# Patient Record
Sex: Male | Born: 1975
Health system: Southern US, Community
[De-identification: ages and names within clinical notes are randomized; demographics above are authoritative.]

## PROBLEM LIST (undated history)

## (undated) DIAGNOSIS — F329 Major depressive disorder, single episode, unspecified: Secondary | ICD-10-CM

## (undated) DIAGNOSIS — F419 Anxiety disorder, unspecified: Secondary | ICD-10-CM

## (undated) DIAGNOSIS — F32A Depression, unspecified: Secondary | ICD-10-CM

## (undated) DIAGNOSIS — R7302 Impaired glucose tolerance (oral): Secondary | ICD-10-CM

## (undated) DIAGNOSIS — K219 Gastro-esophageal reflux disease without esophagitis: Secondary | ICD-10-CM

## (undated) DIAGNOSIS — I10 Essential (primary) hypertension: Secondary | ICD-10-CM

## (undated) DIAGNOSIS — F429 Obsessive-compulsive disorder, unspecified: Secondary | ICD-10-CM

## (undated) HISTORY — DX: Essential (primary) hypertension: I10

## (undated) HISTORY — DX: Gastro-esophageal reflux disease without esophagitis: K21.9

## (undated) HISTORY — DX: Obsessive-compulsive disorder, unspecified: F42.9

## (undated) HISTORY — DX: Depression, unspecified: F32.A

## (undated) HISTORY — DX: Anxiety disorder, unspecified: F41.9

## (undated) HISTORY — DX: Impaired glucose tolerance (oral): R73.02

## (undated) HISTORY — DX: Major depressive disorder, single episode, unspecified: F32.9

---

## 2004-01-22 ENCOUNTER — Emergency Department (HOSPITAL_COMMUNITY): Admission: EM | Admit: 2004-01-22 | Discharge: 2004-01-22 | Payer: Self-pay | Admitting: Emergency Medicine

## 2004-06-12 ENCOUNTER — Emergency Department (HOSPITAL_COMMUNITY): Admission: EM | Admit: 2004-06-12 | Discharge: 2004-06-12 | Payer: Self-pay | Admitting: Emergency Medicine

## 2008-03-10 ENCOUNTER — Encounter: Admission: RE | Admit: 2008-03-10 | Discharge: 2008-03-10 | Payer: Self-pay | Admitting: Family Medicine

## 2008-09-21 ENCOUNTER — Emergency Department (HOSPITAL_COMMUNITY): Admission: EM | Admit: 2008-09-21 | Discharge: 2008-09-21 | Payer: Self-pay | Admitting: Emergency Medicine

## 2008-10-24 ENCOUNTER — Emergency Department (HOSPITAL_COMMUNITY): Admission: EM | Admit: 2008-10-24 | Discharge: 2008-10-24 | Payer: Self-pay | Admitting: Emergency Medicine

## 2009-01-31 ENCOUNTER — Emergency Department (HOSPITAL_COMMUNITY): Admission: EM | Admit: 2009-01-31 | Discharge: 2009-01-31 | Payer: Self-pay | Admitting: Emergency Medicine

## 2009-06-15 ENCOUNTER — Emergency Department (HOSPITAL_COMMUNITY): Admission: EM | Admit: 2009-06-15 | Discharge: 2009-06-15 | Payer: Self-pay | Admitting: Emergency Medicine

## 2011-02-18 LAB — CBC
HCT: 44.1 % (ref 39.0–52.0)
Hemoglobin: 15.5 g/dL (ref 13.0–17.0)
MCHC: 35.1 g/dL (ref 30.0–36.0)
MCV: 87.1 fL (ref 78.0–100.0)
Platelets: 328 10*3/uL (ref 150–400)
RBC: 5.07 MIL/uL (ref 4.22–5.81)
RDW: 12.5 % (ref 11.5–15.5)
WBC: 8.9 10*3/uL (ref 4.0–10.5)

## 2011-02-18 LAB — POCT CARDIAC MARKERS
CKMB, poc: <1 ng/mL — ABNORMAL LOW (ref 1.0–8.0)
Myoglobin, poc: 74.4 ng/mL (ref 12–200)
Troponin i, poc: <0.05 ng/mL (ref 0.00–0.09)

## 2011-02-18 LAB — POCT I-STAT, CHEM 8
BUN: 12 mg/dL (ref 6–23)
Calcium, Ion: 1.05 mmol/L — ABNORMAL LOW (ref 1.12–1.32)
Chloride: 105 mEq/L (ref 96–112)
Creatinine, Ser: 0.9 mg/dL (ref 0.4–1.5)
Glucose, Bld: 94 mg/dL (ref 70–99)
HCT: 44 % (ref 39.0–52.0)
Hemoglobin: 15 g/dL (ref 13.0–17.0)
Potassium: 4.1 mEq/L (ref 3.5–5.1)
Sodium: 138 mEq/L (ref 135–145)
TCO2: 23 mmol/L (ref 0–100)

## 2011-02-18 LAB — DIFFERENTIAL
Basophils Absolute: 0 10*3/uL (ref 0.0–0.1)
Basophils Relative: 0 % (ref 0–1)
Eosinophils Absolute: 0.2 10*3/uL (ref 0.0–0.7)
Eosinophils Relative: 2 % (ref 0–5)
Lymphocytes Relative: 15 % (ref 12–46)
Lymphs Abs: 1.4 10*3/uL (ref 0.7–4.0)
Monocytes Absolute: 0.7 10*3/uL (ref 0.1–1.0)
Monocytes Relative: 7 % (ref 3–12)
Neutro Abs: 6.7 10*3/uL (ref 1.7–7.7)
Neutrophils Relative %: 75 % (ref 43–77)

## 2011-08-18 LAB — DIFFERENTIAL
Basophils Absolute: 0 10*3/uL (ref 0.0–0.1)
Basophils Relative: 1 % (ref 0–1)
Eosinophils Absolute: 0.2 10*3/uL (ref 0.0–0.7)
Eosinophils Relative: 3 % (ref 0–5)
Lymphocytes Relative: 25 % (ref 12–46)
Lymphs Abs: 1.4 10*3/uL (ref 0.7–4.0)
Monocytes Absolute: 0.5 10*3/uL (ref 0.1–1.0)
Monocytes Relative: 9 % (ref 3–12)
Neutro Abs: 3.5 10*3/uL (ref 1.7–7.7)
Neutrophils Relative %: 61 % (ref 43–77)

## 2011-08-18 LAB — CBC
HCT: 44.9 % (ref 39.0–52.0)
Hemoglobin: 15.9 g/dL (ref 13.0–17.0)
MCHC: 35.4 g/dL (ref 30.0–36.0)
MCV: 86.4 fL (ref 78.0–100.0)
Platelets: 302 10*3/uL (ref 150–400)
RBC: 5.19 MIL/uL (ref 4.22–5.81)
RDW: 12 % (ref 11.5–15.5)
WBC: 5.7 10*3/uL (ref 4.0–10.5)

## 2011-08-18 LAB — POCT I-STAT, CHEM 8
BUN: 9 mg/dL (ref 6–23)
Calcium, Ion: 1.08 mmol/L — ABNORMAL LOW (ref 1.12–1.32)
Chloride: 104 mEq/L (ref 96–112)
Creatinine, Ser: 0.9 mg/dL (ref 0.4–1.5)
Glucose, Bld: 116 mg/dL — ABNORMAL HIGH (ref 70–99)
HCT: 45 % (ref 39.0–52.0)
Hemoglobin: 15.3 g/dL (ref 13.0–17.0)
Potassium: 3.6 mEq/L (ref 3.5–5.1)
Sodium: 139 mEq/L (ref 135–145)
TCO2: 27 mmol/L (ref 0–100)

## 2011-08-18 LAB — HEPATIC FUNCTION PANEL
ALT: 23 U/L (ref 0–53)
AST: 31 U/L (ref 0–37)
Albumin: 4.4 g/dL (ref 3.5–5.2)
Alkaline Phosphatase: 66 U/L (ref 39–117)
Bilirubin, Direct: 0.2 mg/dL (ref 0.0–0.3)
Indirect Bilirubin: 0.6 mg/dL (ref 0.3–0.9)
Total Bilirubin: 0.8 mg/dL (ref 0.3–1.2)
Total Protein: 7.1 g/dL (ref 6.0–8.3)

## 2011-08-18 LAB — POCT CARDIAC MARKERS
CKMB, poc: <1 ng/mL — ABNORMAL LOW (ref 1.0–8.0)
Myoglobin, poc: 74.7 ng/mL (ref 12–200)
Troponin i, poc: <0.05 ng/mL (ref 0.00–0.09)

## 2011-08-18 LAB — LIPASE, BLOOD: Lipase: 21 U/L (ref 11–59)

## 2014-12-09 ENCOUNTER — Encounter: Payer: Self-pay | Admitting: *Deleted

## 2014-12-10 ENCOUNTER — Encounter: Payer: Self-pay | Admitting: Interventional Cardiology

## 2014-12-10 ENCOUNTER — Ambulatory Visit (INDEPENDENT_AMBULATORY_CARE_PROVIDER_SITE_OTHER): Payer: 59 | Admitting: Interventional Cardiology

## 2014-12-10 VITALS — BP 162/90 | HR 84 | Ht 70.0 in | Wt 373.8 lb

## 2014-12-10 DIAGNOSIS — R0789 Other chest pain: Secondary | ICD-10-CM

## 2014-12-10 DIAGNOSIS — I10 Essential (primary) hypertension: Secondary | ICD-10-CM

## 2014-12-10 NOTE — Patient Instructions (Signed)
Your physician has requested that you have an echocardiogram. Echocardiography is a painless test that uses sound waves to create images of your heart. It provides your doctor with information about the size and shape of your heart and how well your heart's chambers and valves are working. This procedure takes approximately one hour. There are no restrictions for this procedure.   Your physician recommends that you schedule a follow-up appointment as needed with Dr. Varanasi.  

## 2014-12-10 NOTE — Progress Notes (Signed)
Patient ID: Justin BeaverKevin Reichel, male   DOB: 03-13-1976, 39 y.o.   MRN: [id number]     Patient ID: Justin BeaverKevin Bernier MRN: [id number] DOB/AGE: 03-13-1976 39 y.o.   Referring Physician  Milus HeightNoelle Redmon, PA-C   Reason for Consultation Family h/o CAD  HPI: 39 y/o who has an older brother who had an ascending aortic aneuysm and bicuspid aortic valve.  He had a repair with mechanical valve placement.  The patient is here today to be checked as well.  He has not had any chest pain or SHOB.  He is a IT sales professionalfirefighter.  He does feel a burning at times in the center of his chest.  It may be related to reflux as he says it is more frequent with certain foods.  He walks up stairs frequently without any problems.     Current Outpatient Prescriptions  Medication Sig Dispense Refill  . ALPRAZolam (XANAX) 0.5 MG tablet Take 0.5 mg by mouth daily as needed. For anxiety  0  . Ascorbic Acid (VITAMIN C) 1000 MG tablet Take 1,000 mg by mouth daily.    . cyanocobalamin 500 MCG tablet Take 500 mcg by mouth daily.    Marland Kitchen. DEXILANT 30 MG capsule Take 30 mg by mouth daily.  3  . DULoxetine (CYMBALTA) 60 MG capsule Take 60 mg by mouth daily.  5  . losartan (COZAAR) 50 MG tablet Take 50 mg by mouth daily.  3  . metoprolol succinate (TOPROL-XL) 50 MG 24 hr tablet Take 50 mg by mouth daily.  1  . Omega-3 Fatty Acids (FISH OIL) 1000 MG CAPS Take 1,000 mg by mouth daily.     No current facility-administered medications for this visit.   Past Medical History  Diagnosis Date  . HTN (hypertension)   . GERD (gastroesophageal reflux disease)   . OCD (obsessive compulsive disorder)   . Depression   . Anxiety   . Glucose intolerance (impaired glucose tolerance)     Family History  Problem Relation Age of Onset  . Hypertension Father   . Hypertension Mother   . Heart disease Brother     History   Social History  . Marital Status: Married    Spouse Name: N/A    Number of Children: N/A  . Years of Education: N/A   Occupational  History  . Not on file.   Social History Main Topics  . Smoking status: Never Smoker   . Smokeless tobacco: Not on file  . Alcohol Use: No  . Drug Use: No  . Sexual Activity: Not on file   Other Topics Concern  . Not on file   Social History Narrative    History reviewed. No pertinent past surgical history.    (Not in a hospital admission)  Review of systems complete and found to be negative unless listed above .  No nausea, vomiting.  No fever chills, No focal weakness,  No palpitations.  Physical Exam: Filed Vitals:   12/10/14 1405  BP: 162/90  Pulse: 84    Weight: (!) 373 lb 12.8 oz (169.555 kg)  Physical exam: In no apparent distress [place]/AT EOMI No JVD, No carotid bruit RRR S1S2  No wheezing Soft. NT, nondistended No edema. No focal motor or sensory deficits Normal affect  Labs:   Lab Results  Component Value Date   WBC 8.9 06/15/2009   HGB 15.0 06/15/2009   HCT 44.0 06/15/2009   MCV 87.1 06/15/2009   PLT 328 06/15/2009   No results for input(s): NA,  K, CL, CO2, BUN, CREATININE, CALCIUM, PROT, BILITOT, ALKPHOS, ALT, AST, GLUCOSE in the last 168 hours.  Invalid input(s): LABALBU No results found for: CKTOTAL, CKMB, CKMBINDEX, TROPONINI No results found for: CHOL No results found for: HDL No results found for: LDLCALC No results found for: TRIG No results found for: CHOLHDL No results found for: LDLDIRECT     EKG: NSR, IRBBB, nonspecific ST segment changes  ASSESSMENT AND PLAN:   HTN: Amlodipine was recently changed to losartan 50 mg daily.  At other appts, BP has been well controlled.   Obesity:  THis is his biggest long term issue.  He was counseled to try to exercise more and eat healthy to lose weight.    Chest discomfort.  He has some discomfort in his chest.,  It is a burning.  Will check echo.  COuld be reflux.  Given brother with bicuspid Ao valve, will also check for structural heart disease.   Signed:   Fredric Mare, MD,  [facility] 12/10/2014, 2:35 PM

## 2014-12-22 ENCOUNTER — Other Ambulatory Visit (HOSPITAL_COMMUNITY): Payer: 59

## 2014-12-22 ENCOUNTER — Ambulatory Visit (HOSPITAL_COMMUNITY): Payer: 59 | Attending: Interventional Cardiology | Admitting: Cardiology

## 2014-12-22 ENCOUNTER — Other Ambulatory Visit (HOSPITAL_COMMUNITY): Payer: 59 | Admitting: Cardiology

## 2014-12-22 DIAGNOSIS — R0789 Other chest pain: Secondary | ICD-10-CM

## 2014-12-22 MED ORDER — PERFLUTREN LIPID MICROSPHERE
2.0000 mL | Freq: Once | INTRAVENOUS | Status: AC
  Administered 2014-12-22: 2 mL via INTRAVENOUS

## 2014-12-22 NOTE — Progress Notes (Signed)
Echo performed. 

## 2015-03-19 ENCOUNTER — Emergency Department (HOSPITAL_BASED_OUTPATIENT_CLINIC_OR_DEPARTMENT_OTHER): Payer: 59

## 2015-03-19 ENCOUNTER — Encounter (HOSPITAL_BASED_OUTPATIENT_CLINIC_OR_DEPARTMENT_OTHER): Payer: Self-pay

## 2015-03-19 ENCOUNTER — Emergency Department (HOSPITAL_BASED_OUTPATIENT_CLINIC_OR_DEPARTMENT_OTHER)
Admission: EM | Admit: 2015-03-19 | Discharge: 2015-03-19 | Disposition: A | Discharge status: Home / Self Care | Payer: 59 | Attending: Emergency Medicine | Admitting: Emergency Medicine

## 2015-03-19 DIAGNOSIS — Y939 Activity, unspecified: Secondary | ICD-10-CM | POA: Diagnosis not present

## 2015-03-19 DIAGNOSIS — Y280XXA Contact with sharp glass, undetermined intent, initial encounter: Secondary | ICD-10-CM | POA: Insufficient documentation

## 2015-03-19 DIAGNOSIS — F419 Anxiety disorder, unspecified: Secondary | ICD-10-CM | POA: Insufficient documentation

## 2015-03-19 DIAGNOSIS — F329 Major depressive disorder, single episode, unspecified: Secondary | ICD-10-CM | POA: Insufficient documentation

## 2015-03-19 DIAGNOSIS — Y999 Unspecified external cause status: Secondary | ICD-10-CM | POA: Diagnosis not present

## 2015-03-19 DIAGNOSIS — K219 Gastro-esophageal reflux disease without esophagitis: Secondary | ICD-10-CM | POA: Insufficient documentation

## 2015-03-19 DIAGNOSIS — I10 Essential (primary) hypertension: Secondary | ICD-10-CM | POA: Diagnosis not present

## 2015-03-19 DIAGNOSIS — Y929 Unspecified place or not applicable: Secondary | ICD-10-CM | POA: Insufficient documentation

## 2015-03-19 DIAGNOSIS — Z8659 Personal history of other mental and behavioral disorders: Secondary | ICD-10-CM | POA: Diagnosis not present

## 2015-03-19 DIAGNOSIS — Z79899 Other long term (current) drug therapy: Secondary | ICD-10-CM | POA: Diagnosis not present

## 2015-03-19 DIAGNOSIS — Z23 Encounter for immunization: Secondary | ICD-10-CM | POA: Insufficient documentation

## 2015-03-19 DIAGNOSIS — S81812A Laceration without foreign body, left lower leg, initial encounter: Secondary | ICD-10-CM | POA: Diagnosis not present

## 2015-03-19 MED ORDER — KETOROLAC TROMETHAMINE 60 MG/2ML IM SOLN
60.0000 mg | Freq: Once | INTRAMUSCULAR | Status: AC
  Administered 2015-03-19: 60 mg via INTRAMUSCULAR
  Filled 2015-03-19: qty 2

## 2015-03-19 MED ORDER — LIDOCAINE HCL 2 % IJ SOLN
20.0000 mL | Freq: Once | INTRAMUSCULAR | Status: AC
  Administered 2015-03-19: 10 mL via INTRADERMAL
  Filled 2015-03-19: qty 20

## 2015-03-19 MED ORDER — IBUPROFEN 800 MG PO TABS
800.0000 mg | ORAL_TABLET | Freq: Once | ORAL | Status: AC
  Administered 2015-03-19: 800 mg via ORAL
  Filled 2015-03-19: qty 1

## 2015-03-19 MED ORDER — TETANUS-DIPHTH-ACELL PERTUSSIS 5-2.5-18.5 LF-MCG/0.5 IM SUSP
0.5000 mL | Freq: Once | INTRAMUSCULAR | Status: AC
  Administered 2015-03-19: 0.5 mL via INTRAMUSCULAR
  Filled 2015-03-19: qty 0.5

## 2015-03-19 NOTE — ED Notes (Signed)
Pt reports a laceration from a piece of farm equipment today that broke off and cut his left lower leg - last tetanus - 6 years ago - 6 cm x 4 cm x 2 cm wound to LLE - wound cleansed at this time, bleeding controlled at this time with gauze placed.

## 2015-03-19 NOTE — ED Provider Notes (Signed)
CSN: [id number]     Arrival date & time 03/19/15  1543 History   First MD Initiated Contact with Patient 03/19/15 1810     Chief Complaint  Patient presents with  . Laceration     (Consider location/radiation/quality/duration/timing/severity/associated sxs/prior Treatment) HPI  Justin Alvarez is a 39 y.o. male presenting with laceration to left lower leg after piece of farm equipment that was metal broke off and cut his leg. This occurred around 3 PM this afternoon. Patient denies significant pain. Bleeding controlled. She denies fevers, chills, numbness,tingling, weakness.   Past Medical History  Diagnosis Date  . HTN (hypertension)   . GERD (gastroesophageal reflux disease)   . OCD (obsessive compulsive disorder)   . Depression   . Anxiety   . Glucose intolerance (impaired glucose tolerance)    History reviewed. No pertinent past surgical history. Family History  Problem Relation Age of Onset  . Hypertension Father   . Hypertension Mother   . Heart disease Brother    History  Substance Use Topics  . Smoking status: Never Smoker   . Smokeless tobacco: Not on file  . Alcohol Use: No    Review of Systems  Musculoskeletal: Negative for myalgias and joint swelling.  Skin: Positive for wound. Negative for color change.  Neurological: Negative for weakness and numbness.      Allergies  Ace inhibitors  Home Medications   Prior to Admission medications   Medication Sig Start Date End Date Taking? Authorizing Provider  ALPRAZolam Prudy Feeler(XANAX) 0.5 MG tablet Take 0.5 mg by mouth daily as needed. For anxiety 09/03/14  Yes Historical Provider, MD  Ascorbic Acid (VITAMIN C) 1000 MG tablet Take 1,000 mg by mouth daily.   Yes Historical Provider, MD  cyanocobalamin 500 MCG tablet Take 500 mcg by mouth daily.   Yes Historical Provider, MD  DEXILANT 30 MG capsule Take 30 mg by mouth daily. 12/01/14  Yes Historical Provider, MD  DULoxetine (CYMBALTA) 60 MG capsule Take 60 mg by mouth  daily. 10/28/14  Yes Historical Provider, MD  losartan (COZAAR) 50 MG tablet Take 50 mg by mouth daily. 11/18/14  Yes Historical Provider, MD  metoprolol succinate (TOPROL-XL) 50 MG 24 hr tablet Take 50 mg by mouth daily. 12/04/14  Yes Historical Provider, MD  Omega-3 Fatty Acids (FISH OIL) 1000 MG CAPS Take 1,000 mg by mouth daily.   Yes Historical Provider, MD   BP 161/90 mmHg  Pulse 78  Resp 20  Ht 5\' 11"  (1.803 m)  Wt 325 lb (147.419 kg)  BMI 45.35 kg/m2  SpO2 100% Physical Exam  Constitutional: He appears well-developed and well-nourished. No distress.  HENT:  Head: Normocephalic and atraumatic.  Eyes: Conjunctivae are normal. Right eye exhibits no discharge. Left eye exhibits no discharge.  Cardiovascular:  2+ DP and PT pulses equal bilaterally.  Pulmonary/Chest: Effort normal. No respiratory distress.  Neurological: He is alert. Coordination normal.  5/5 strength in bilateral lower extremities. Sensation intact.  Skin: He is not diaphoretic.  3 cm laceration to left lower extremity at mid shin without gross contamination without any evidence of infection. Bleeding controlled.  Psychiatric: He has a normal mood and affect. His behavior is normal.  Nursing note and vitals reviewed.   ED Course  Procedures (including critical care time) Labs Review Labs Reviewed - No data to display  Imaging Review Dg Tibia/fibula Left  03/19/2015   CLINICAL DATA:  Laceration from a piece of farm equipment today. Injury to the lower leg. Initial encounter.  EXAM: LEFT TIBIA AND FIBULA - 2 VIEW  COMPARISON:  Left ankle radiographs 02/23/2006  FINDINGS: No acute fracture of the tibia or fibula is identified. The knee and ankle are located. New, mild to moderate degenerative changes are noted at the tibiotalar articulation. Small plantar and posterior calcaneal enthesophytes appear slightly larger than on the prior study. No lytic or blastic osseous lesion. No soft tissue abnormality or radiopaque  foreign body.  IMPRESSION: 1. No acute osseous abnormality or radiopaque foreign body identified. 2. Degenerative changes at the ankle.   Electronically Signed   By: Sebastian AcheAllen  Grady   On: 03/19/2015 18:30     EKG Interpretation None       LACERATION REPAIR Performed by: Oswaldo ConroyREECH, Fowler Antos Authorized by: Oswaldo ConroyREECH, Shaina Gullatt Consent: Verbal consent obtained. Risks and benefits: risks, benefits and alternatives were discussed Consent given by: patient Patient identity confirmed: provided demographic data Prepped and Draped in normal sterile fashion Wound explored  Laceration Location: Dorsal mid shin of left lower extremity  Laceration Length: 3-4 cm  No Foreign Bodies seen or palpated  Anesthesia: local infiltration  Local anesthetic: lidocaine 2% w/o epinephrine  Anesthetic total: 6 ml  Irrigation method: syringe Amount of cleaning: standard  Skin closure: 3-0 Proline  Number of sutures: 4  Technique: simple interrupted  Patient tolerance: Patient tolerated the procedure well with no immediate complications.   MDM   Final diagnoses:  Laceration of lower extremity, left, initial encounter   Tdap booster given. Laceration occurred < 8 hours prior to repair which was well tolerated. VSS. Neurovascularly intact. Laceration anesthetized, irrigated and repaired without immediate complications. Bacitracin and sterile dressing applied. No indication for antibiotics Discussed suture home care w pt and return with evidence of infection and answered questions. Pt to f-u for wound check and suture removal in 8-10 days. Pt is hemodynamically stable w no complaints prior to dc.    Discussed return precautions with patient. Discussed all results and patient verbalizes understanding and agrees with plan.      Oswaldo ConroyVictoria Barton Want, PA-C 03/19/15 1930  Rolan BuccoMelanie Belfi, MD 03/19/15 [phone]

## 2015-03-19 NOTE — Discharge Instructions (Signed)
Keep wound dry and do not remove dressing for 24 hours if possible. After that, wash gently morning and night (every 12 hours) with soap and water. Use a topical antibiotic ointment and cover with a bandaid or gauze.  °  °Do NOT use rubbing alcohol or hydrogen peroxide, do not soak the area °  °Present to your primary care doctor or the urgent care of your choice, or the ED for suture removal in 7-10 days. °  °Every attempt was made to remove foreign body (contaminants) from the wound.  However, there is always a chance that some may remain in the wound. This can  increase your risk of infection. °  °If you see signs of infection (warmth, redness, tenderness, pus, sharp increase in pain, fever, red streaking in the skin) immediately return to the emergency department. °  °After the wound heals fully, apply sunscreen for 6-12 months to minimize scarring.  ° ° °Laceration Care, Adult °A laceration is a cut or lesion that goes through all layers of the skin and into the tissue just beneath the skin. °TREATMENT  °Some lacerations may not require closure. Some lacerations may not be able to be closed due to an increased risk of infection. It is important to see your caregiver as soon as possible after an injury to minimize the risk of infection and maximize the opportunity for successful closure. °If closure is appropriate, pain medicines may be given, if needed. The wound will be cleaned to help prevent infection. Your caregiver will use stitches (sutures), staples, wound glue (adhesive), or skin adhesive strips to repair the laceration. These tools bring the skin edges together to allow for faster healing and a better cosmetic outcome. However, all wounds will heal with a scar. Once the wound has healed, scarring can be minimized by covering the wound with sunscreen during the day for 1 full year. °HOME CARE INSTRUCTIONS  °For sutures or staples: °· Keep the wound clean and dry. °· If you were given a bandage  (dressing), you should change it at least once a day. Also, change the dressing if it becomes wet or dirty, or as directed by your caregiver. °· Wash the wound with soap and water 2 times a day. Rinse the wound off with water to remove all soap. Pat the wound dry with a clean towel. °· After cleaning, apply a thin layer of the antibiotic ointment as recommended by your caregiver. This will help prevent infection and keep the dressing from sticking. °· You may shower as usual after the first 24 hours. Do not soak the wound in water until the sutures are removed. °· Only take over-the-counter or prescription medicines for pain, discomfort, or fever as directed by your caregiver. °· Get your sutures or staples removed as directed by your caregiver. °For skin adhesive strips: °· Keep the wound clean and dry. °· Do not get the skin adhesive strips wet. You may bathe carefully, using caution to keep the wound dry. °· If the wound gets wet, pat it dry with a clean towel. °· Skin adhesive strips will fall off on their own. You may trim the strips as the wound heals. Do not remove skin adhesive strips that are still stuck to the wound. They will fall off in time. °For wound adhesive: °· You may briefly wet your wound in the shower or bath. Do not soak or scrub the wound. Do not swim. Avoid periods of heavy perspiration until the skin adhesive has fallen off   on its own. After showering or bathing, gently pat the wound dry with a clean towel. °· Do not apply liquid medicine, cream medicine, or ointment medicine to your wound while the skin adhesive is in place. This may loosen the film before your wound is healed. °· If a dressing is placed over the wound, be careful not to apply tape directly over the skin adhesive. This may cause the adhesive to be pulled off before the wound is healed. °· Avoid prolonged exposure to sunlight or tanning lamps while the skin adhesive is in place. Exposure to ultraviolet light in the first  year will darken the scar. °· The skin adhesive will usually remain in place for 5 to 10 days, then naturally fall off the skin. Do not pick at the adhesive film. °You may need a tetanus shot if: °· You cannot remember when you had your last tetanus shot. °· You have never had a tetanus shot. °If you get a tetanus shot, your arm may swell, get red, and feel warm to the touch. This is common and not a problem. If you need a tetanus shot and you choose not to have one, there is a rare chance of getting tetanus. Sickness from tetanus can be serious. °SEEK MEDICAL CARE IF:  °· You have redness, swelling, or increasing pain in the wound. °· You see a red line that goes away from the wound. °· You have yellowish-white fluid (pus) coming from the wound. °· You have a fever. °· You notice a bad smell coming from the wound or dressing. °· Your wound breaks open before or after sutures have been removed. °· You notice something coming out of the wound such as wood or glass. °· Your wound is on your hand or foot and you cannot move a finger or toe. °SEEK IMMEDIATE MEDICAL CARE IF:  °· Your pain is not controlled with prescribed medicine. °· You have severe swelling around the wound causing pain and numbness or a change in color in your arm, hand, leg, or foot. °· Your wound splits open and starts bleeding. °· You have worsening numbness, weakness, or loss of function of any joint around or beyond the wound. °· You develop painful lumps near the wound or on the skin anywhere on your body. °MAKE SURE YOU:  °· Understand these instructions. °· Will watch your condition. °· Will get help right away if you are not doing well or get worse. °Document Released: 10/30/2005 Document Revised: 01/22/2012 Document Reviewed: 04/25/2011 °ExitCare® Patient Information ©2015 [employer]. This information is not intended to replace advice given to you by your health care provider. Make sure you discuss any questions you have with your health  care provider. ° ° °

## 2016-05-07 ENCOUNTER — Emergency Department (HOSPITAL_COMMUNITY)
Admission: EM | Admit: 2016-05-07 | Discharge: 2016-05-07 | Disposition: A | Discharge status: Home / Self Care | Payer: 59 | Attending: Emergency Medicine | Admitting: Emergency Medicine

## 2016-05-07 ENCOUNTER — Emergency Department (HOSPITAL_COMMUNITY): Payer: 59

## 2016-05-07 ENCOUNTER — Encounter (HOSPITAL_COMMUNITY): Payer: Self-pay | Admitting: *Deleted

## 2016-05-07 DIAGNOSIS — R1011 Right upper quadrant pain: Secondary | ICD-10-CM | POA: Diagnosis present

## 2016-05-07 DIAGNOSIS — F329 Major depressive disorder, single episode, unspecified: Secondary | ICD-10-CM | POA: Diagnosis not present

## 2016-05-07 DIAGNOSIS — Z79899 Other long term (current) drug therapy: Secondary | ICD-10-CM | POA: Diagnosis not present

## 2016-05-07 DIAGNOSIS — K8020 Calculus of gallbladder without cholecystitis without obstruction: Secondary | ICD-10-CM | POA: Diagnosis not present

## 2016-05-07 DIAGNOSIS — I10 Essential (primary) hypertension: Secondary | ICD-10-CM | POA: Insufficient documentation

## 2016-05-07 LAB — CBC
HCT: 42 % (ref 39.0–52.0)
Hemoglobin: 14.1 g/dL (ref 13.0–17.0)
MCH: 27.4 pg (ref 26.0–34.0)
MCHC: 33.6 g/dL (ref 30.0–36.0)
MCV: 81.6 fL (ref 78.0–100.0)
Platelets: 339 10*3/uL (ref 150–400)
RBC: 5.15 MIL/uL (ref 4.22–5.81)
RDW: 13.4 % (ref 11.5–15.5)
WBC: 11.2 10*3/uL — ABNORMAL HIGH (ref 4.0–10.5)

## 2016-05-07 LAB — URINALYSIS, ROUTINE W REFLEX MICROSCOPIC
Bilirubin Urine: NEGATIVE
Glucose, UA: NEGATIVE mg/dL
Hgb urine dipstick: NEGATIVE
Ketones, ur: NEGATIVE mg/dL
Leukocytes, UA: NEGATIVE
Nitrite: NEGATIVE
Protein, ur: NEGATIVE mg/dL
Specific Gravity, Urine: 1.023 (ref 1.005–1.030)
pH: 6.5 (ref 5.0–8.0)

## 2016-05-07 LAB — COMPREHENSIVE METABOLIC PANEL
ALT: 30 U/L (ref 17–63)
AST: 25 U/L (ref 15–41)
Albumin: 4.4 g/dL (ref 3.5–5.0)
Alkaline Phosphatase: 92 U/L (ref 38–126)
Anion gap: 7 (ref 5–15)
BUN: 11 mg/dL (ref 6–20)
CO2: 28 mmol/L (ref 22–32)
Calcium: 9.2 mg/dL (ref 8.9–10.3)
Chloride: 103 mmol/L (ref 101–111)
Creatinine, Ser: 0.89 mg/dL (ref 0.61–1.24)
GFR calc Af Amer: >60 mL/min (ref >60)
GFR calc non Af Amer: >60 mL/min (ref >60)
Glucose, Bld: 108 mg/dL — ABNORMAL HIGH (ref 65–99)
Potassium: 3.8 mmol/L (ref 3.5–5.1)
Sodium: 138 mmol/L (ref 135–145)
Total Bilirubin: 0.4 mg/dL (ref 0.3–1.2)
Total Protein: 7.9 g/dL (ref 6.5–8.1)

## 2016-05-07 LAB — LIPASE, BLOOD: Lipase: 25 U/L (ref 11–51)

## 2016-05-07 MED ORDER — MORPHINE SULFATE (PF) 4 MG/ML IV SOLN
4.0000 mg | Freq: Once | INTRAVENOUS | Status: AC
  Administered 2016-05-07: 4 mg via INTRAVENOUS
  Filled 2016-05-07: qty 1

## 2016-05-07 MED ORDER — FAMOTIDINE IN NACL 20-0.9 MG/50ML-% IV SOLN
20.0000 mg | Freq: Once | INTRAVENOUS | Status: AC
  Administered 2016-05-07: 20 mg via INTRAVENOUS
  Filled 2016-05-07: qty 50

## 2016-05-07 MED ORDER — SODIUM CHLORIDE 0.9 % IV BOLUS (SEPSIS)
500.0000 mL | Freq: Once | INTRAVENOUS | Status: AC
  Administered 2016-05-07: 500 mL via INTRAVENOUS

## 2016-05-07 NOTE — Discharge Instructions (Signed)
Biliary Colic °Biliary colic is a pain in the upper abdomen. The pain: °· Is usually felt on the right side of the abdomen, but it may also be felt in the center of the abdomen, just below the breastbone (sternum). °· May spread back toward the right shoulder blade. °· May be steady or irregular. °· May be accompanied by nausea and vomiting. °Most of the time, the pain goes away in 1-5 hours. After the most intense pain passes, the abdomen may continue to ache mildly for about 24 hours. °Biliary colic is caused by a blockage in the bile duct. The bile duct is a pathway that carries bile--a liquid that helps to digest fats--from the gallbladder to the small intestine. Biliary colic usually occurs after eating, when the digestive system demands bile. The pain develops when muscle cells contract forcefully to try to move the blockage so that bile can get by. °HOME CARE INSTRUCTIONS °· Take medicines only as directed by your health care provider. °· Drink enough fluid to keep your urine clear or pale yellow. °· Avoid fatty, greasy, and fried foods. These kinds of foods increase your body's demand for bile. °· Avoid any foods that make your pain worse. °· Avoid overeating. °· Avoid having a large meal after fasting. °SEEK MEDICAL CARE IF: °· You develop a fever. °· Your pain gets worse. °· You vomit. °· You develop nausea that prevents you from eating and drinking. °SEEK IMMEDIATE MEDICAL CARE IF: °· You suddenly develop a fever and shaking chills. °· You develop a yellowish discoloration (jaundice) of: °¨ Skin. °¨ Whites of the eyes. °¨ Mucous membranes. °· You have continuous or severe pain that is not relieved with medicines. °· You have nausea and vomiting that is not relieved with medicines. °· You develop dizziness or you faint. °  °This information is not intended to replace advice given to you by your health care provider. Make sure you discuss any questions you have with your health care provider. °  °Document  Released: 04/02/2006 Document Revised: 03/16/2015 Document Reviewed: 08/11/2014 °Elsevier Interactive Patient Education ©2016 [employer] ° °

## 2016-05-07 NOTE — ED Provider Notes (Signed)
CSN: [id number]     Arrival date & time 05/07/16  1812 History   First MD Initiated Contact with Patient 05/07/16 1840     Chief Complaint  Patient presents with  . Abdominal Pain     (Consider location/radiation/quality/duration/timing/severity/associated sxs/prior Treatment) HPI  Lynann BeaverKevin Raygoza is a 40 y.o M with a pmhx of GERD, HTN, obesity who presents to the ED today c/o abdominal pain. Pt states that he ate [place] food for lunch And shortly after he developed severe right upper quadrant abdominal pain. He describes the pain as dull and "it feels like I have gas that I can't burp out". Patient states that the pain kind of ease off suddenly laid down for a nap when he woke up from his nap the pain was much more severe. Pain is worsened with movement and improved with sitting still. She denies associated nausea or vomiting, fevers, chest pain or shortness of breath. Patient has never experienced this before.   Past Medical History  Diagnosis Date  . HTN (hypertension)   . GERD (gastroesophageal reflux disease)   . OCD (obsessive compulsive disorder)   . Depression   . Anxiety   . Glucose intolerance (impaired glucose tolerance)    History reviewed. No pertinent past surgical history. Family History  Problem Relation Age of Onset  . Hypertension Father   . Hypertension Mother   . Heart disease Brother    Social History  Substance Use Topics  . Smoking status: Never Smoker   . Smokeless tobacco: None  . Alcohol Use: No    Review of Systems  All other systems reviewed and are negative.     Allergies  Ace inhibitors  Home Medications   Prior to Admission medications   Medication Sig Start Date End Date Taking? Authorizing Provider  ALPRAZolam Prudy Feeler(XANAX) 0.5 MG tablet Take 0.5 mg by mouth daily as needed. For anxiety 09/03/14   Historical Provider, MD  Ascorbic Acid (VITAMIN C) 1000 MG tablet Take 1,000 mg by mouth daily.    Historical Provider, MD  cyanocobalamin 500 MCG  tablet Take 500 mcg by mouth daily.    Historical Provider, MD  DEXILANT 30 MG capsule Take 30 mg by mouth daily. 12/01/14   Historical Provider, MD  DULoxetine (CYMBALTA) 60 MG capsule Take 60 mg by mouth daily. 10/28/14   Historical Provider, MD  losartan (COZAAR) 50 MG tablet Take 50 mg by mouth daily. 11/18/14   Historical Provider, MD  metoprolol succinate (TOPROL-XL) 50 MG 24 hr tablet Take 50 mg by mouth daily. 12/04/14   Historical Provider, MD  Omega-3 Fatty Acids (FISH OIL) 1000 MG CAPS Take 1,000 mg by mouth daily.    Historical Provider, MD   BP 150/83 mmHg  Pulse 81  Temp(Src) 99 F (37.2 C) (Oral)  Resp 20  SpO2 98% Physical Exam  Constitutional: He is oriented to person, place, and time. He appears well-developed and well-nourished. No distress.  HENT:  Head: Normocephalic and atraumatic.  Mouth/Throat: No oropharyngeal exudate.  Eyes: Conjunctivae and EOM are normal. Pupils are equal, round, and reactive to light. Right eye exhibits no discharge. Left eye exhibits no discharge. No scleral icterus.  Cardiovascular: Normal rate, regular rhythm, normal heart sounds and intact distal pulses.  Exam reveals no gallop and no friction rub.   No murmur heard. Pulmonary/Chest: Effort normal and breath sounds normal. No respiratory distress. He has no wheezes. He has no rales. He exhibits no tenderness.  Abdominal: Soft. Bowel sounds are normal.  He exhibits no distension and no mass. There is tenderness ( murphy's sign). There is no rebound and no guarding.  Musculoskeletal: Normal range of motion. He exhibits no edema.  Neurological: He is alert and oriented to person, place, and time.  Skin: Skin is warm and dry. No rash noted. He is not diaphoretic. No erythema. No pallor.  Psychiatric: He has a normal mood and affect. His behavior is normal.  Nursing note and vitals reviewed.   ED Course  Procedures (including critical care time) Labs Review Labs Reviewed  COMPREHENSIVE  METABOLIC PANEL - Abnormal; Notable for the following:    Glucose, Bld 108 (*)    All other components within normal limits  CBC - Abnormal; Notable for the following:    WBC 11.2 (*)    All other components within normal limits  LIPASE, BLOOD  URINALYSIS, ROUTINE W REFLEX MICROSCOPIC (NOT AT [facility])    Imaging Review No results found. I have personally reviewed and evaluated these images and lab results as part of my medical decision-making.   EKG Interpretation None      MDM   Final diagnoses:  RUQ abdominal pain   40 year old male with no significant past medical history who presents the ED today complaining of right upper quadrant abdominal pain after eating [place] food today. Patient appears uncomfortable in the ED but is otherwise nontoxic and nonseptic appearing. There is a positive Murphy sign on his abdomen exam. Concern for cholecystitis versus cholelithiasis. Mild leukocytosis, WBC is 11.2. All the lab work is within normal limits. Right upper quadrant ultrasound is ordered. Pain managed in ED.  Patient was signed out to Lyndal Pulleyaniel Knott, MD at shift change pending US.     Lester KinsmanSamantha Tripp HarrisDowless, PA-C 05/07/16 2115  Lyndal Pulleyaniel Knott, MD 05/08/16 [phone]

## 2016-05-07 NOTE — ED Notes (Signed)
Pt complains of RUQ/epigastric pain since eating lunch today. Pt states he took nap and felt worse. Pt denies n/v/d.

## 2016-05-07 NOTE — ED Notes (Signed)
MD at bedside. 

## 2016-05-07 NOTE — ED Notes (Signed)
Pt given a urinal to obtain specimen

## 2016-05-07 NOTE — ED Notes (Signed)
Pt reports understanding of discharge information. No questions at time of discharge 

## 2016-12-18 DIAGNOSIS — I10 Essential (primary) hypertension: Secondary | ICD-10-CM | POA: Diagnosis not present

## 2016-12-18 DIAGNOSIS — Z0000 Encounter for general adult medical examination without abnormal findings: Secondary | ICD-10-CM | POA: Diagnosis not present

## 2016-12-18 DIAGNOSIS — R7302 Impaired glucose tolerance (oral): Secondary | ICD-10-CM | POA: Diagnosis not present

## 2017-03-27 DIAGNOSIS — L259 Unspecified contact dermatitis, unspecified cause: Secondary | ICD-10-CM | POA: Diagnosis not present

## 2017-08-15 DIAGNOSIS — K219 Gastro-esophageal reflux disease without esophagitis: Secondary | ICD-10-CM | POA: Diagnosis not present

## 2017-08-15 DIAGNOSIS — I10 Essential (primary) hypertension: Secondary | ICD-10-CM | POA: Diagnosis not present

## 2017-08-15 DIAGNOSIS — Z23 Encounter for immunization: Secondary | ICD-10-CM | POA: Diagnosis not present

## 2017-09-22 ENCOUNTER — Other Ambulatory Visit: Payer: Self-pay

## 2017-09-22 ENCOUNTER — Emergency Department (HOSPITAL_COMMUNITY)
Admission: EM | Admit: 2017-09-22 | Discharge: 2017-09-22 | Disposition: A | Discharge status: Home / Self Care | Payer: 59 | Attending: Emergency Medicine | Admitting: Emergency Medicine

## 2017-09-22 ENCOUNTER — Emergency Department (HOSPITAL_COMMUNITY): Payer: 59

## 2017-09-22 ENCOUNTER — Encounter (HOSPITAL_COMMUNITY): Payer: Self-pay

## 2017-09-22 DIAGNOSIS — Y929 Unspecified place or not applicable: Secondary | ICD-10-CM | POA: Insufficient documentation

## 2017-09-22 DIAGNOSIS — Z79899 Other long term (current) drug therapy: Secondary | ICD-10-CM | POA: Diagnosis not present

## 2017-09-22 DIAGNOSIS — I10 Essential (primary) hypertension: Secondary | ICD-10-CM | POA: Diagnosis not present

## 2017-09-22 DIAGNOSIS — W28XXXA Contact with powered lawn mower, initial encounter: Secondary | ICD-10-CM | POA: Diagnosis not present

## 2017-09-22 DIAGNOSIS — S61219A Laceration without foreign body of unspecified finger without damage to nail, initial encounter: Secondary | ICD-10-CM | POA: Diagnosis not present

## 2017-09-22 DIAGNOSIS — Y998 Other external cause status: Secondary | ICD-10-CM | POA: Diagnosis not present

## 2017-09-22 DIAGNOSIS — S61213A Laceration without foreign body of left middle finger without damage to nail, initial encounter: Secondary | ICD-10-CM | POA: Diagnosis not present

## 2017-09-22 DIAGNOSIS — Y9389 Activity, other specified: Secondary | ICD-10-CM | POA: Diagnosis not present

## 2017-09-22 DIAGNOSIS — S6992XA Unspecified injury of left wrist, hand and finger(s), initial encounter: Secondary | ICD-10-CM | POA: Diagnosis present

## 2017-09-22 MED ORDER — HYDROCODONE-ACETAMINOPHEN 5-325 MG PO TABS
1.0000 | ORAL_TABLET | Freq: Once | ORAL | Status: AC
  Administered 2017-09-22: 1 via ORAL
  Filled 2017-09-22: qty 1

## 2017-09-22 MED ORDER — LIDOCAINE HCL (PF) 1 % IJ SOLN
10.0000 mL | Freq: Once | INTRAMUSCULAR | Status: AC
  Administered 2017-09-22: 10 mL
  Filled 2017-09-22: qty 10

## 2017-09-22 NOTE — ED Provider Notes (Signed)
[facility]-EMERGENCY DEPT Provider Note   CSN: [id number] Arrival date & time: 09/22/17  1332     History   Chief Complaint Chief Complaint  Patient presents with  . Finger Injury    HPI Justin Alvarez is a 41 y.o. male with a PMHx of GERD, HTN, OCD, impaired glucose tolerance, morbid obesity, anxiety, and depression, who presents to the ED with complaints of left index and middle finger lacerations sustained around 1 PM, approximately 1.5 hours ago.  Patient states that he was adjusting a lawnmower, which [place] however the [place] blades were still turning, and he accidentally cut his index and middle fingers.  The bleeding has been controlled with pressure.  He is not on any blood thinners.  He describes the pain as 9/10 intermittent sharp nonradiating left index and middle finger pain, worse with palpation to the area, and with no treatments tried prior to arrival.  His last tetanus shot was 2 years ago.  He reports some mild swelling and bruising to the areas of the lacerations.  He denies any numbness, tingling, focal weakness, loss of range of motion of the fingers, or any other injuries or complaints at this time. He is not diabetic nor does he have any immunocompromising conditions. 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    The history is provided by the patient and medical records. No language interpreter was used.  Laceration   The incident occurred 1 to 2 hours ago. The laceration is located on the left hand. The laceration is 1 cm in size. The laceration mechanism was a a metal edge. The pain is at a severity of 9/10. The pain is moderate. The pain has been intermittent since onset. He reports no foreign bodies present. His tetanus status is UTD.    Past Medical History:  Diagnosis Date  . Anxiety   . Depression   . GERD (gastroesophageal reflux disease)   . Glucose intolerance (impaired  glucose tolerance)   . HTN (hypertension)   . OCD (obsessive compulsive disorder)     Patient Active Problem List   Diagnosis Date Noted  . Morbid obesity (HCC) 12/10/2014  . Essential hypertension 12/10/2014    History reviewed. No pertinent surgical history.     Home Medications    Prior to Admission medications   Medication Sig Start Date End Date Taking? Authorizing Provider  ALPRAZolam Prudy Feeler(XANAX) 0.5 MG tablet Take 0.5 mg by mouth daily as needed. For anxiety 09/03/14   [provider]  Ascorbic Acid (VITAMIN C) 1000 MG tablet Take 1,000 mg by mouth daily. Reported on 05/07/2016    [provider]  DEXILANT 30 MG capsule Take 30 mg by mouth daily. 12/01/14   [provider]  DULoxetine (CYMBALTA) 60 MG capsule Take 60 mg by mouth daily. 10/28/14   [provider]  losartan (COZAAR) 50 MG tablet Take 50 mg by mouth daily. 11/18/14   [provider]  metoprolol succinate (TOPROL-XL) 50 MG 24 hr tablet Take 50 mg by mouth daily. 12/04/14   [provider]  Omega-3 Fatty Acids (FISH OIL) 1000 MG CAPS Take 1,000 mg by mouth daily. Reported on 05/07/2016    [provider]    Family History Family History  Problem Relation Age of Onset  . Hypertension Mother   . Hypertension Father   . Heart disease Brother     Social  History Social History   Tobacco Use  . Smoking status: Never Smoker  Substance Use Topics  . Alcohol use: No    Alcohol/week: 0.0 oz  . Drug use: No     Allergies   Ace inhibitors   Review of Systems Review of Systems  Musculoskeletal: Positive for joint swelling and myalgias.  Skin: Positive for color change and wound.  Allergic/Immunologic: Negative for immunocompromised state.  Neurological: Negative for weakness and numbness.  Hematological: Does not bruise/bleed easily.  Psychiatric/Behavioral: Negative for confusion.     Physical Exam Updated Vital Signs BP (!) 179/93   Pulse  97   Temp 98.6 F (37 C) (Oral)   Resp 20   SpO2 97%    Physical Exam  Constitutional: He is oriented to person, place, and time. Vital signs are normal. He appears well-developed and well-nourished.  Non-toxic appearance. No distress.  Afebrile, nontoxic, NAD, HTN noted similar to prior visits  HENT:  Head: Normocephalic and atraumatic.  Mouth/Throat: Mucous membranes are normal.  Eyes: Conjunctivae and EOM are normal. Right eye exhibits no discharge. Left eye exhibits no discharge.  Neck: Normal range of motion. Neck supple.  Cardiovascular: Normal rate and intact distal pulses.  Pulmonary/Chest: Effort normal. No respiratory distress.  Abdominal: Normal appearance. He exhibits no distension.  Musculoskeletal: Normal range of motion.  See skin exam  Neurological: He is alert and oriented to person, place, and time. He has normal strength. No sensory deficit.  Skin: Skin is warm and dry. Laceration noted. No rash noted.  L index finger with triangular section of superficial layers of skin sliced off from the fingerpad, no ongoing bleeding. L middle finger with small ~1cm linear laceration to fingerpad without ongoing bleeding. No focal bony TTP, no crepitus or deformity, no retained FBs noted, no bruising, no swelling, and FROM intact in all joints of all digits. Strength and sensation grossly intact, distal pulses intact, compartments soft. SEE PICTURE BELOW  Psychiatric: He has a normal mood and affect.  Nursing note and vitals reviewed.      ED Treatments / Results  Labs (all labs ordered are listed, but only abnormal results are displayed) Labs Reviewed - No data to display  EKG  EKG Interpretation None       Radiology Dg Hand Complete Left  Result Date: 09/22/2017 CLINICAL DATA:  Trauma.  Lacerations. EXAM: LEFT HAND - COMPLETE 3+ VIEW COMPARISON:  None. FINDINGS: Lacerations are seen to the radial aspects of the distal second and third fingers. No foreign bodies,  fractures, or dislocations. IMPRESSION: Lacerations to the distal second and third fingers. No other abnormalities. Electronically Signed   By: Gerome Samavid  Williams III M.D   On: 09/22/2017 14:54    Procedures .Marland Kitchen.Laceration Repair Date/Time: 09/22/2017 3:34 PM Performed by: Rhona RaiderStreet, Tranae Lar[place]ie, PA-C Authorized by: Rhona RaiderStreet, Romanita Fager, [place]C   Consent:    Consent obtained:  Verbal   Consent given by:  Patient   Risks discussed:  Infection and pain   Alternatives discussed:  No treatment Anesthesia (see MAR for exact dosages):    Anesthesia method:  Local infiltration   Local anesthetic:  Lidocaine 1% w/o epi Laceration details:    Location:  Finger   Finger location:  L long finger   Length (cm):  1   Depth (mm):  5 Repair type:    Repair type:  Simple Pre-procedure details:    Preparation:  Patient was prepped and draped in usual sterile fashion and imaging obtained to evaluate for foreign  bodies Exploration:    Hemostasis achieved with:  Direct pressure   Wound exploration: wound explored through full range of motion and entire depth of wound probed and visualized     Wound extent: no foreign bodies/material noted     Contaminated: no   Treatment:    Area cleansed with:  Saline   Amount of cleaning:  Extensive   Irrigation solution:  Sterile saline   Irrigation method:  Syringe Skin repair:    Repair method:  Sutures   Suture size:  4-0   Suture material:  Prolene   Suture technique:  Simple interrupted   Number of sutures:  3 Approximation:    Approximation:  Close   Vermilion border: well-aligned   Post-procedure details:    Dressing:  Non-adherent dressing   Patient tolerance of procedure:  Tolerated well, no immediate complications   (including critical care time)  Medications Ordered in ED Medications  lidocaine (PF) (XYLOCAINE) 1 % injection 10 mL (10 mLs Infiltration Given 09/22/17 1515)  HYDROcodone-acetaminophen (NORCO/VICODIN) 5-325 MG per tablet 1 tablet (1 tablet  Oral Given 09/22/17 1501)     Initial Impression / Assessment and Plan / ED Course  I have reviewed the triage vital signs and the nursing notes.  Pertinent labs & imaging results that were available during my care of the patient were reviewed by me and considered in my medical decision making (see chart for details).     42 y.o. male here with L 2nd and 3rd finger lacs, was adjusting blades on lawnmower and blades were still turning, unfortunately cut fingers in the process. On exam, index finger with small triangle shaped section of superficial skin layers missing, but no ongoing bleeding; middle finger with ~1cm linear lac to fingerpad, no ongoing bleeding; no retained FBs noted. NVI with soft compartments, no focal bony TTP, FROM intact. Index finger will have to just heal by secondary intent since there is a flap of tissue missing, but should heal well without difficulty; will repair middle finger with suture for quicker healing. Will get xray to ensure no retained FBs or underlying fx. Will reassess shortly.   3:35 PM Xray negative. L middle finger repaired with 3 simple interrupted 4-0 prolene sutures with adequate hemostasis and wound closure achieved. Nonadherent dressing provided for L index finger, discussed proper wound care of both areas. Advised tylenol/motrin for pain, RICE discussed, and f/up with PCP in 3 days for wound recheck, then in 7-10 days for suture removal. Doubt need for ppx abx. I explained the diagnosis and have given explicit precautions to return to the ER including for any other new or worsening symptoms. The patient understands and accepts the medical plan as it's been dictated and I have answered their questions. Discharge instructions concerning home care and prescriptions have been given. The patient is STABLE and is discharged to home in good condition.    Final Clinical Impressions(s) / ED Diagnoses   Final diagnoses:  Laceration of finger of left hand  without foreign body without damage to nail, unspecified finger, initial encounter    ED Discharge Orders    [address] [place], [place] 09/22/17 1536    Arby Barrette, MD 09/22/17 1705

## 2017-09-22 NOTE — ED Triage Notes (Signed)
Pt reports that he was adjusting a lawn mower and did not know that blades were still turning and cut his middle and index finger on the left hand. Pt reports 9/10 throbbing pain , area is bleeding

## 2017-09-22 NOTE — Discharge Instructions (Signed)
Keep wound clean with mild soap and water. Keep area covered with a topical antibiotic ointment and bandage, keep bandage dry, and do not submerge in water for 24 hours. Ice and elevate for additional pain and swelling relief. Alternate between Ibuprofen and Tylenol for additional pain relief. Follow up with your primary care doctor or the [facility] in approximately 3 days for wound recheck and again in 7-10 days for wound recheck and suture removal. Monitor area for signs of infection to include, but not limited to: increasing pain, spreading redness, drainage/pus, worsening swelling, or fevers. Return to emergency department for emergent changing or worsening symptoms.

## 2018-03-12 DIAGNOSIS — Z0000 Encounter for general adult medical examination without abnormal findings: Secondary | ICD-10-CM | POA: Diagnosis not present

## 2018-03-12 DIAGNOSIS — I10 Essential (primary) hypertension: Secondary | ICD-10-CM | POA: Diagnosis not present

## 2018-03-12 DIAGNOSIS — K219 Gastro-esophageal reflux disease without esophagitis: Secondary | ICD-10-CM | POA: Diagnosis not present

## 2018-03-12 DIAGNOSIS — R7302 Impaired glucose tolerance (oral): Secondary | ICD-10-CM | POA: Diagnosis not present

## 2018-07-16 DIAGNOSIS — H66001 Acute suppurative otitis media without spontaneous rupture of ear drum, right ear: Secondary | ICD-10-CM | POA: Diagnosis not present

## 2018-07-16 DIAGNOSIS — I10 Essential (primary) hypertension: Secondary | ICD-10-CM | POA: Diagnosis not present

## 2018-07-16 DIAGNOSIS — Z23 Encounter for immunization: Secondary | ICD-10-CM | POA: Diagnosis not present

## 2018-07-29 DIAGNOSIS — H6981 Other specified disorders of Eustachian tube, right ear: Secondary | ICD-10-CM | POA: Diagnosis not present

## 2018-10-29 DIAGNOSIS — I10 Essential (primary) hypertension: Secondary | ICD-10-CM | POA: Diagnosis not present

## 2018-10-29 DIAGNOSIS — R7302 Impaired glucose tolerance (oral): Secondary | ICD-10-CM | POA: Diagnosis not present

## 2018-12-25 DIAGNOSIS — M25561 Pain in right knee: Secondary | ICD-10-CM | POA: Diagnosis not present

## 2019-08-15 ENCOUNTER — Other Ambulatory Visit: Payer: Self-pay

## 2019-08-15 ENCOUNTER — Encounter (HOSPITAL_COMMUNITY): Payer: Self-pay

## 2019-08-15 ENCOUNTER — Ambulatory Visit (HOSPITAL_COMMUNITY)
Admission: EM | Admit: 2019-08-15 | Discharge: 2019-08-15 | Disposition: A | Discharge status: Home / Self Care | Payer: 59 | Attending: Family Medicine | Admitting: Family Medicine

## 2019-08-15 DIAGNOSIS — T148XXA Other injury of unspecified body region, initial encounter: Secondary | ICD-10-CM | POA: Diagnosis not present

## 2019-08-15 MED ORDER — CEPHALEXIN 500 MG PO CAPS: 500.0000 mg | ORAL_CAPSULE | Freq: Four times a day (QID) | ORAL | 0 refills | Status: AC

## 2019-08-15 MED ORDER — LIDOCAINE-EPINEPHRINE-TETRACAINE (LET) SOLUTION
3.0000 mL | Freq: Once | NASAL | Status: AC
  Administered 2019-08-15: 3 mL via TOPICAL

## 2019-08-15 MED ORDER — LIDOCAINE-EPINEPHRINE-TETRACAINE (LET) SOLUTION
NASAL | Status: AC
  Filled 2019-08-15: qty 3

## 2019-08-15 NOTE — ED Provider Notes (Signed)
[place]    CSN: [id number] Arrival date & time: 08/15/19  1242      History   Chief Complaint Chief Complaint  Patient presents with  . Foreign Body in Skin    HPI Justin Alvarez is a 43 y.o. male.   HPI  Patient has a wooden splinter in the left pinky since yesterday.  Tetanus is up-to-date.  It is underneath his nail.  He is tried to remove it but the pain will not allow him to take deep enough.  He also had some bleeding.  Past Medical History:  Diagnosis Date  . Anxiety   . Depression   . GERD (gastroesophageal reflux disease)   . Glucose intolerance (impaired glucose tolerance)   . HTN (hypertension)   . OCD (obsessive compulsive disorder)     Patient Active Problem List   Diagnosis Date Noted  . Morbid obesity ([place]) 12/10/2014  . Essential hypertension 12/10/2014    History reviewed. No pertinent surgical history.     Home Medications    Prior to Admission medications   Medication Sig Start Date End Date Taking? Authorizing Provider  ALPRAZolam Duanne Moron) 0.5 MG tablet Take 0.5 mg by mouth daily as needed. For anxiety 09/03/14   [provider]  Ascorbic Acid (VITAMIN C) 1000 MG tablet Take 1,000 mg by mouth daily. Reported on 05/07/2016    [provider]  cephALEXin (KEFLEX) 500 MG capsule Take 1 capsule (500 mg total) by mouth 4 (four) times daily. 08/15/19   Raylene Everts, MD  DEXILANT 30 MG capsule Take 30 mg by mouth daily. 12/01/14   [provider]  DULoxetine (CYMBALTA) 60 MG capsule Take 60 mg by mouth daily. 10/28/14   [provider]  losartan (COZAAR) 50 MG tablet Take 50 mg by mouth daily. 11/18/14   [provider]  metoprolol succinate (TOPROL-XL) 50 MG 24 hr tablet Take 50 mg by mouth daily. 12/04/14   [provider]  Omega-3 Fatty Acids (FISH OIL) 1000 MG CAPS Take 1,000 mg by mouth daily. Reported on 05/07/2016    [provider]    Family History Family History   Problem Relation Age of Onset  . Hypertension Mother   . Hypertension Father   . Heart disease Brother     Social History Social History   Tobacco Use  . Smoking status: Never Smoker  . Smokeless tobacco: Never Used  Substance Use Topics  . Alcohol use: No    Alcohol/week: 0.0 standard drinks  . Drug use: No     Allergies   Ace inhibitors   Review of Systems Review of Systems  Constitutional: Negative for chills and fever.  HENT: Negative for ear pain and sore throat.   Eyes: Negative for pain and visual disturbance.  Respiratory: Negative for cough and shortness of breath.   Cardiovascular: Negative for chest pain and palpitations.  Gastrointestinal: Negative for abdominal pain and vomiting.  Genitourinary: Negative for dysuria and hematuria.  Musculoskeletal: Negative for arthralgias and back pain.  Skin: Positive for wound. Negative for color change and rash.  Neurological: Negative for seizures and syncope.  All other systems reviewed and are negative.    Physical Exam Triage Vital Signs ED Triage Vitals  Enc Vitals Group     BP 08/15/19 1254 (!) 160/88     Pulse --      Resp 08/15/19 1254 17     Temp 08/15/19 1254 98.2 F (36.8 C)  Temp Source 08/15/19 1254 Oral     SpO2 08/15/19 1254 98 %     Weight --      Height --      Head Circumference --      Peak Flow --      Pain Score 08/15/19 1252 1     Pain Loc --      Pain Edu? --      Excl. in GC? --    No data found.  Updated Vital Signs BP (!) 160/88 (BP Location: Left Arm)   Temp 98.2 F (36.8 C) (Oral)   Resp 17   SpO2 98%        Physical Exam Constitutional:      General: He is not in acute distress.    Appearance: He is well-developed. He is obese.  HENT:     Head: Normocephalic and atraumatic.  Eyes:     Conjunctiva/sclera: Conjunctivae normal.     Pupils: Pupils are equal, round, and reactive to light.  Neck:     Musculoskeletal: Normal range of motion.  Cardiovascular:      Rate and Rhythm: Normal rate.  Pulmonary:     Effort: Pulmonary effort is normal. No respiratory distress.  Abdominal:     General: There is no distension.     Palpations: Abdomen is soft.  Musculoskeletal: Normal range of motion.  Skin:    General: Skin is warm and dry.     Comments: Underneath the fifth fingernail there is a visible black splinter at the lateral edge.  Patient was unable to tolerate removal with just splinter forceps.  I then injected 1 cc of 1% lidocaine (plain) and was easily able to remove the splinter.  Area was irrigated.  Band-Aid placed.   Neurological:     General: No focal deficit present.     Mental Status: He is alert.     Sensory: No sensory deficit.  Psychiatric:        Mood and Affect: Mood normal.        Behavior: Behavior normal.      UC Treatments / Results  Labs (all labs ordered are listed, but only abnormal results are displayed) Labs Reviewed - No data to display  EKG   Radiology No results found.  Procedures Procedures (including critical care time)  Medications Ordered in UC Medications  lidocaine-EPINEPHrine-tetracaine (LET) solution (3 mLs Topical Given 08/15/19 1306)  lidocaine-EPINEPHrine-tetracaine (LET) solution (has no administration in time range)    Initial Impression / Assessment and Plan / UC Course  I have reviewed the triage vital signs and the nursing notes.  Pertinent labs & imaging results that were available during my care of the patient were reviewed by me and considered in my medical decision making (see chart for details).     Gust wound care and signs of infection Final Clinical Impressions(s) / UC Diagnoses   Final diagnoses:  Splinter in skin     Discharge Instructions     Wash daily and apply antibiotic ointment for the next couple of days Watch for infection I have printed a prescription for an antibiotic for you to fill and use if needed Call for problems Your tetanus is up-to-date    ED Prescriptions    Medication Sig Dispense Auth. Provider   cephALEXin (KEFLEX) 500 MG capsule Take 1 capsule (500 mg total) by mouth 4 (four) times daily. 20 capsule Eustace MooreNelson, Emagene Merfeld Sue, MD     PDMP not reviewed this encounter.  Eustace Moore, MD 08/15/19 2121

## 2019-08-15 NOTE — Discharge Instructions (Signed)
Wash daily and apply antibiotic ointment for the next couple of days Watch for infection I have printed a prescription for an antibiotic for you to fill and use if needed Call for problems Your tetanus is up-to-date

## 2019-08-15 NOTE — ED Triage Notes (Signed)
Patient presents to Urgent Care with complaints of splinter in left pinky since yesterday. Patient reports his tetanus is up to date.

## 2020-03-25 ENCOUNTER — Other Ambulatory Visit: Payer: Self-pay

## 2020-03-25 ENCOUNTER — Encounter: Payer: Self-pay | Admitting: Emergency Medicine

## 2020-03-25 ENCOUNTER — Ambulatory Visit
Admission: EM | Admit: 2020-03-25 | Discharge: 2020-03-25 | Disposition: A | Discharge status: Home / Self Care | Payer: 59 | Attending: Family Medicine | Admitting: Family Medicine

## 2020-03-25 DIAGNOSIS — M79605 Pain in left leg: Secondary | ICD-10-CM

## 2020-03-25 DIAGNOSIS — T300 Burn of unspecified body region, unspecified degree: Secondary | ICD-10-CM

## 2020-03-25 MED ORDER — SULFAMETHOXAZOLE-TRIMETHOPRIM 800-160 MG PO TABS: 1.0000 | ORAL_TABLET | Freq: Two times a day (BID) | ORAL | 0 refills | Status: AC

## 2020-03-25 MED ORDER — SILVER SULFADIAZINE 1 % EX CREA: 1.0000 "application " | TOPICAL_CREAM | Freq: Every day | CUTANEOUS | 0 refills | Status: AC
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
## 2020-03-25 NOTE — Discharge Instructions (Signed)
I have sent in silvadene cream for you to apply twice daily  I have also sent in Bactrim for you to take twice daily for 7 days  Follow up as needed.

## 2021-03-02 ENCOUNTER — Other Ambulatory Visit: Payer: Self-pay | Admitting: Family Medicine

## 2021-03-02 DIAGNOSIS — R319 Hematuria, unspecified: Secondary | ICD-10-CM

## 2021-03-03 ENCOUNTER — Ambulatory Visit
Admission: RE | Admit: 2021-03-03 | Discharge: 2021-03-03 | Disposition: A | Discharge status: Home / Self Care | Payer: 59 | Source: Ambulatory Visit | Attending: Family Medicine | Admitting: Family Medicine

## 2021-03-03 ENCOUNTER — Other Ambulatory Visit: Payer: Self-pay

## 2021-03-03 DIAGNOSIS — R319 Hematuria, unspecified: Secondary | ICD-10-CM

## 2021-03-04 ENCOUNTER — Encounter (HOSPITAL_COMMUNITY)
Admission: AD | Disposition: A | Discharge status: Home / Self Care | Payer: Self-pay | Source: Home / Self Care | Attending: Urology

## 2021-03-04 ENCOUNTER — Other Ambulatory Visit: Payer: Self-pay

## 2021-03-04 ENCOUNTER — Inpatient Hospital Stay (HOSPITAL_COMMUNITY): Payer: 59 | Admitting: Anesthesiology

## 2021-03-04 ENCOUNTER — Encounter (HOSPITAL_COMMUNITY): Payer: Self-pay

## 2021-03-04 ENCOUNTER — Inpatient Hospital Stay (HOSPITAL_COMMUNITY): Payer: 59

## 2021-03-04 ENCOUNTER — Encounter (HOSPITAL_COMMUNITY): Payer: Self-pay | Admitting: Urology

## 2021-03-04 ENCOUNTER — Emergency Department (HOSPITAL_COMMUNITY)
Admission: EM | Admit: 2021-03-04 | Discharge: 2021-03-04 | Disposition: A | Discharge status: Home / Self Care | Payer: 59 | Source: Home / Self Care | Attending: Emergency Medicine | Admitting: Emergency Medicine

## 2021-03-04 ENCOUNTER — Ambulatory Visit (HOSPITAL_COMMUNITY)
Admission: AD | Admit: 2021-03-04 | Discharge: 2021-03-04 | Disposition: A | Discharge status: Home / Self Care | Payer: 59 | Attending: Urology | Admitting: Urology

## 2021-03-04 ENCOUNTER — Other Ambulatory Visit: Payer: Self-pay | Admitting: Urology

## 2021-03-04 DIAGNOSIS — N201 Calculus of ureter: Secondary | ICD-10-CM | POA: Insufficient documentation

## 2021-03-04 DIAGNOSIS — I10 Essential (primary) hypertension: Secondary | ICD-10-CM | POA: Insufficient documentation

## 2021-03-04 DIAGNOSIS — N132 Hydronephrosis with renal and ureteral calculous obstruction: Secondary | ICD-10-CM | POA: Diagnosis present

## 2021-03-04 DIAGNOSIS — Z79899 Other long term (current) drug therapy: Secondary | ICD-10-CM | POA: Insufficient documentation

## 2021-03-04 DIAGNOSIS — Z8042 Family history of malignant neoplasm of prostate: Secondary | ICD-10-CM | POA: Insufficient documentation

## 2021-03-04 DIAGNOSIS — Z8249 Family history of ischemic heart disease and other diseases of the circulatory system: Secondary | ICD-10-CM | POA: Diagnosis not present

## 2021-03-04 DIAGNOSIS — Z20822 Contact with and (suspected) exposure to covid-19: Secondary | ICD-10-CM | POA: Diagnosis not present

## 2021-03-04 HISTORY — PX: CYSTOSCOPY/URETEROSCOPY/HOLMIUM LASER/STENT PLACEMENT: SHX6546

## 2021-03-04 LAB — CBC WITH DIFFERENTIAL/PLATELET
Abs Immature Granulocytes: 0.04 10*3/uL (ref 0.00–0.07)
Basophils Absolute: 0.1 10*3/uL (ref 0.0–0.1)
Basophils Relative: 1 %
Eosinophils Absolute: 0.3 10*3/uL (ref 0.0–0.5)
Eosinophils Relative: 4 %
HCT: 42.8 % (ref 39.0–52.0)
Hemoglobin: 14.3 g/dL (ref 13.0–17.0)
Immature Granulocytes: 1 %
Lymphocytes Relative: 18 %
Lymphs Abs: 1.3 10*3/uL (ref 0.7–4.0)
MCH: 28.8 pg (ref 26.0–34.0)
MCHC: 33.4 g/dL (ref 30.0–36.0)
MCV: 86.1 fL (ref 80.0–100.0)
Monocytes Absolute: 0.6 10*3/uL (ref 0.1–1.0)
Monocytes Relative: 9 %
Neutro Abs: 4.9 10*3/uL (ref 1.7–7.7)
Neutrophils Relative %: 67 %
Platelets: 331 10*3/uL (ref 150–400)
RBC: 4.97 MIL/uL (ref 4.22–5.81)
RDW: 12.7 % (ref 11.5–15.5)
WBC: 7.2 10*3/uL (ref 4.0–10.5)
nRBC: 0 % (ref 0.0–0.2)

## 2021-03-04 LAB — RESP PANEL BY RT-PCR (FLU A&B, COVID) ARPGX2
Influenza A by PCR: NEGATIVE
Influenza B by PCR: NEGATIVE
SARS Coronavirus 2 by RT PCR: NEGATIVE

## 2021-03-04 LAB — BASIC METABOLIC PANEL
Anion gap: 10 (ref 5–15)
BUN: 12 mg/dL (ref 6–20)
CO2: 23 mmol/L (ref 22–32)
Calcium: 9 mg/dL (ref 8.9–10.3)
Chloride: 106 mmol/L (ref 98–111)
Creatinine, Ser: 0.99 mg/dL (ref 0.61–1.24)
GFR, Estimated: >60 mL/min (ref >60)
Glucose, Bld: 136 mg/dL — ABNORMAL HIGH (ref 70–99)
Potassium: 3.9 mmol/L (ref 3.5–5.1)
Sodium: 139 mmol/L (ref 135–145)

## 2021-03-04 SURGERY — CYSTOSCOPY/URETEROSCOPY/HOLMIUM LASER/STENT PLACEMENT
Anesthesia: General | Laterality: Right

## 2021-03-04 MED ORDER — HYDROMORPHONE HCL 1 MG/ML IJ SOLN
1.0000 mg | Freq: Once | INTRAMUSCULAR | Status: AC
Start: 2021-03-04 — End: 2021-03-04
  Administered 2021-03-04: 1 mg via INTRAVENOUS
  Filled 2021-03-04: qty 1

## 2021-03-04 MED ORDER — ONDANSETRON HCL 4 MG/2ML IJ SOLN
4.0000 mg | Freq: Once | INTRAMUSCULAR | Status: AC
  Administered 2021-03-04: 4 mg via INTRAVENOUS
  Filled 2021-03-04: qty 2

## 2021-03-04 MED ORDER — MORPHINE SULFATE (PF) 4 MG/ML IV SOLN
4.0000 mg | Freq: Once | INTRAVENOUS | Status: AC
  Administered 2021-03-04: 4 mg via INTRAVENOUS
  Filled 2021-03-04: qty 1

## 2021-03-04 MED ORDER — CHLORHEXIDINE GLUCONATE 0.12 % MT SOLN
15.0000 mL | OROMUCOSAL | Status: AC
  Administered 2021-03-04: 15 mL via OROMUCOSAL

## 2021-03-04 MED ORDER — AMISULPRIDE (ANTIEMETIC) 5 MG/2ML IV SOLN: 10.0000 mg | Freq: Once | INTRAVENOUS | Status: DC | PRN

## 2021-03-04 MED ORDER — PROPOFOL 10 MG/ML IV BOLUS
INTRAVENOUS | Status: DC | PRN
  Administered 2021-03-04 (×2): 200 mg via INTRAVENOUS

## 2021-03-04 MED ORDER — FENTANYL CITRATE (PF) 100 MCG/2ML IJ SOLN: 50.0000 ug | INTRAMUSCULAR | Status: DC

## 2021-03-04 MED ORDER — KETOROLAC TROMETHAMINE 15 MG/ML IJ SOLN
15.0000 mg | Freq: Once | INTRAMUSCULAR | Status: AC
  Administered 2021-03-04: 15 mg via INTRAVENOUS

## 2021-03-04 MED ORDER — HYDROMORPHONE HCL 1 MG/ML IJ SOLN
0.5000 mg | Freq: Once | INTRAMUSCULAR | Status: AC
Start: 2021-03-04 — End: 2021-03-04
  Administered 2021-03-04: 0.5 mg via INTRAVENOUS

## 2021-03-04 MED ORDER — LACTATED RINGERS IV SOLN
INTRAVENOUS | Status: DC
Start: 2021-03-04 — End: 2021-03-05

## 2021-03-04 MED ORDER — OXYCODONE-ACETAMINOPHEN 5-325 MG PO TABS: 1.0000 | ORAL_TABLET | ORAL | 0 refills | Status: DC | PRN

## 2021-03-04 MED ORDER — OXYCODONE HCL 5 MG PO TABS: 5.0000 mg | ORAL_TABLET | Freq: Once | ORAL | Status: DC | PRN

## 2021-03-04 MED ORDER — HYDRALAZINE HCL 20 MG/ML IJ SOLN
10.0000 mg | Freq: Once | INTRAMUSCULAR | Status: AC
  Administered 2021-03-04: 10 mg via INTRAVENOUS

## 2021-03-04 MED ORDER — ONDANSETRON HCL 4 MG/2ML IJ SOLN: 4.0000 mg | Freq: Once | INTRAMUSCULAR | Status: AC

## 2021-03-04 MED ORDER — FENTANYL CITRATE (PF) 100 MCG/2ML IJ SOLN
INTRAMUSCULAR | Status: DC | PRN
  Administered 2021-03-04: 50 ug via INTRAVENOUS

## 2021-03-04 MED ORDER — HYDROMORPHONE HCL 1 MG/ML IJ SOLN
INTRAMUSCULAR | Status: AC
  Filled 2021-03-04: qty 1

## 2021-03-04 MED ORDER — LIDOCAINE 2% (20 MG/ML) 5 ML SYRINGE
INTRAMUSCULAR | Status: DC | PRN
  Administered 2021-03-04: 100 mg via INTRAVENOUS

## 2021-03-04 MED ORDER — HYDROMORPHONE HCL 1 MG/ML IJ SOLN
0.5000 mg | Freq: Once | INTRAMUSCULAR | Status: DC
  Filled 2021-03-04: qty 1

## 2021-03-04 MED ORDER — PROMETHAZINE HCL 25 MG/ML IJ SOLN: 6.2500 mg | INTRAMUSCULAR | Status: DC | PRN

## 2021-03-04 MED ORDER — HYDROMORPHONE HCL 1 MG/ML IJ SOLN
0.2500 mg | INTRAMUSCULAR | Status: DC | PRN
  Administered 2021-03-04 (×2): 0.5 mg via INTRAVENOUS

## 2021-03-04 MED ORDER — TAMSULOSIN HCL 0.4 MG PO CAPS: 0.4000 mg | ORAL_CAPSULE | Freq: Every day | ORAL | 1 refills | Status: AC

## 2021-03-04 MED ORDER — ONDANSETRON HCL 4 MG/2ML IJ SOLN
INTRAMUSCULAR | Status: DC | PRN
  Administered 2021-03-04: 4 mg via INTRAVENOUS

## 2021-03-04 MED ORDER — KETOROLAC TROMETHAMINE 15 MG/ML IJ SOLN
INTRAMUSCULAR | Status: AC
  Filled 2021-03-04: qty 1

## 2021-03-04 MED ORDER — SODIUM CHLORIDE 0.9 % IR SOLN
Status: DC | PRN
  Administered 2021-03-04: 1000 mL

## 2021-03-04 MED ORDER — FENTANYL CITRATE (PF) 100 MCG/2ML IJ SOLN
INTRAMUSCULAR | Status: AC
  Administered 2021-03-04: 50 ug
  Filled 2021-03-04: qty 2

## 2021-03-04 MED ORDER — CEFAZOLIN IN SODIUM CHLORIDE 3-0.9 GM/100ML-% IV SOLN
3.0000 g | INTRAVENOUS | Status: DC
  Filled 2021-03-04: qty 100

## 2021-03-04 MED ORDER — ONDANSETRON HCL 4 MG/2ML IJ SOLN
INTRAMUSCULAR | Status: AC
  Administered 2021-03-04: 4 mg
  Filled 2021-03-04: qty 2

## 2021-03-04 MED ORDER — DEXAMETHASONE SODIUM PHOSPHATE 10 MG/ML IJ SOLN
INTRAMUSCULAR | Status: DC | PRN
  Administered 2021-03-04: 10 mg via INTRAVENOUS

## 2021-03-04 MED ORDER — DEXTROSE 5 % IV SOLN
INTRAVENOUS | Status: DC | PRN
  Administered 2021-03-04: 3 g via INTRAVENOUS

## 2021-03-04 MED ORDER — FENTANYL CITRATE (PF) 100 MCG/2ML IJ SOLN
INTRAMUSCULAR | Status: AC
  Filled 2021-03-04: qty 2

## 2021-03-04 MED ORDER — KETOROLAC TROMETHAMINE 15 MG/ML IJ SOLN
15.0000 mg | Freq: Once | INTRAMUSCULAR | Status: AC
  Administered 2021-03-04: 15 mg via INTRAVENOUS
  Filled 2021-03-04: qty 1

## 2021-03-04 MED ORDER — IOHEXOL 300 MG/ML  SOLN
INTRAMUSCULAR | Status: DC | PRN
  Administered 2021-03-04: 12 mL

## 2021-03-04 MED ORDER — HYDRALAZINE HCL 20 MG/ML IJ SOLN
INTRAMUSCULAR | Status: AC
  Filled 2021-03-04: qty 1

## 2021-03-04 MED ORDER — OXYCODONE HCL 5 MG/5ML PO SOLN: 5.0000 mg | Freq: Once | ORAL | Status: DC | PRN

## 2021-03-04 MED ORDER — OXYCODONE-ACETAMINOPHEN 5-325 MG PO TABS: 1.0000 | ORAL_TABLET | ORAL | 0 refills | Status: AC | PRN

## 2021-03-04 SURGICAL SUPPLY — 19 items
BAG URO CATCHER STRL LF (MISCELLANEOUS) ×2 IMPLANT
BASKET ZERO TIP NITINOL 2.4FR (BASKET) IMPLANT
BSKT STON RTRVL ZERO TP 2.4FR (BASKET)
EXTRACTOR STONE 1.7FRX115CM (UROLOGICAL SUPPLIES) IMPLANT
GLOVE SURG ENC MOIS LTX SZ7.5 (GLOVE) ×2 IMPLANT
GOWN STRL REUS W/TWL XL LVL3 (GOWN DISPOSABLE) ×2 IMPLANT
GUIDEWIRE ANG ZIPWIRE 038X150 (WIRE) IMPLANT
GUIDEWIRE STR DUAL SENSOR (WIRE) ×3 IMPLANT
KIT TURNOVER KIT A (KITS) ×2 IMPLANT
LASER FIB FLEXIVA PULSE ID 365 (Laser) IMPLANT
MANIFOLD NEPTUNE II (INSTRUMENTS) ×2 IMPLANT
PACK CYSTO (CUSTOM PROCEDURE TRAY) ×2 IMPLANT
SHEATH URETERAL 12FRX28CM (UROLOGICAL SUPPLIES) IMPLANT
SHEATH URETERAL 12FRX35CM (MISCELLANEOUS) ×1 IMPLANT
STENT URET 6FRX26 CONTOUR (STENTS) ×1 IMPLANT
TRACTIP FLEXIVA PULS ID 200XHI (Laser) IMPLANT
TRACTIP FLEXIVA PULSE ID 200 (Laser)
TUBING CONNECTING 10 (TUBING) ×2 IMPLANT
TUBING UROLOGY SET (TUBING) ×2 IMPLANT

## 2021-03-04 NOTE — Anesthesia Preprocedure Evaluation (Signed)
Anesthesia Evaluation  Patient identified by MRN, date of birth, ID band Patient awake    Reviewed: Allergy & Precautions, NPO status , Patient's Chart, lab work & pertinent test results  Airway Mallampati: II  TM Distance: >3 FB Neck ROM: Full    Dental no notable dental hx.    Pulmonary neg pulmonary ROS,    Pulmonary exam normal breath sounds clear to auscultation       Cardiovascular hypertension, Pt. on medications negative cardio ROS Normal cardiovascular exam Rhythm:Regular Rate:Normal     Neuro/Psych Anxiety Depression negative neurological ROS  negative psych ROS   GI/Hepatic Neg liver ROS, GERD  ,  Endo/Other  Morbid obesity  Renal/GU negative Renal ROS  negative genitourinary   Musculoskeletal negative musculoskeletal ROS (+)   Abdominal (+) + obese,   Peds negative pediatric ROS (+)  Hematology negative hematology ROS (+)   Anesthesia Other Findings   Reproductive/Obstetrics negative OB ROS                             Anesthesia Physical Anesthesia Plan  ASA: III  Anesthesia Plan: General   Post-op Pain Management:    Induction: Intravenous  PONV Risk Score and Plan: 2 and Ondansetron, Midazolam and Treatment may vary due to age or medical condition  Airway Management Planned: LMA  Additional Equipment:   Intra-op Plan:   Post-operative Plan: Extubation in OR  Informed Consent: I have reviewed the patients History and Physical, chart, labs and discussed the procedure including the risks, benefits and alternatives for the proposed anesthesia with the patient or authorized representative who has indicated his/her understanding and acceptance.     Dental advisory given  Plan Discussed with: CRNA  Anesthesia Plan Comments:         Anesthesia Quick Evaluation

## 2021-03-04 NOTE — Discharge Instructions (Addendum)
Justin Alvarez, It was a pleasure meeting you. Given your kidney stone, we have talked to our urologists who will set up an appointment to see you this afternoon. In the meantime, please make sure to not drink water or eat food in case you need surgery. I have sent in pain medications to your pharmacy as well.  Thank you, Evlyn Kanner, MD

## 2021-03-04 NOTE — H&P (Signed)
CC/HPI: CC: Right-sided flank pain  HPI:  03/04/2021  45 year old male with a 1 cm right proximal ureteral calculus presented to the emergency department on the 21st. That is when he was diagnosed with the proximal ureteral calculus by CT could. He presented again today with right-sided flank pain. I therefore had him present to our office for evaluation. He has never had a stone before. He continues to have significant pain and discomfort. Denies any fever, nausea, vomiting.     ALLERGIES: Ace Inhibitors    MEDICATIONS: Metoprolol Tartrate  Dexilant  Duloxetine Hcl  Fenofibrate  Losartan Potassium  Xanax     GU PSH: None   NON-GU PSH: None   GU PMH: None   NON-GU PMH: Anxiety GERD Hypertension    FAMILY HISTORY: 2 sons - Other Prostate Cancer - Father   SOCIAL HISTORY: Marital Status: Unknown Preferred Language: English; Race: White Current Smoking Status: Patient has never smoked.   Tobacco Use Assessment Completed: Used Tobacco in last 30 days? Drinks 2 caffeinated drinks per day.    REVIEW OF SYSTEMS:    GU Review Male:   Patient denies frequent urination, hard to postpone urination, burning/ pain with urination, get up at night to urinate, leakage of urine, stream starts and stops, trouble starting your stream, have to strain to urinate , erection problems, and penile pain.  Gastrointestinal (Upper):   Patient denies nausea, vomiting, and indigestion/ heartburn.  Gastrointestinal (Lower):   Patient denies diarrhea and constipation.  Constitutional:   Patient denies fever, night sweats, weight loss, and fatigue.  Skin:   Patient denies skin rash/ lesion and itching.  Eyes:   Patient denies blurred vision and double vision.  Ears/ Nose/ Throat:   Patient denies sore throat and sinus problems.  Hematologic/Lymphatic:   Patient denies swollen glands and easy bruising.  Cardiovascular:   Patient denies leg swelling and chest pains.  Respiratory:   Patient denies cough  and shortness of breath.  Endocrine:   Patient denies excessive thirst.  Musculoskeletal:   Patient denies back pain and joint pain.  Neurological:   Patient denies headaches and dizziness.  Psychologic:   Patient denies depression and anxiety.   Notes: hematuria    VITAL SIGNS:      03/04/2021 04:10 PM  Weight 380 lb / 172.37 kg  Height 71 in / 180.34 cm  BP 169/87 mmHg  Heart Rate 66 /min  Temperature 98.2 F / 36.7 C  BMI 53.0 kg/m   MULTI-SYSTEM PHYSICAL EXAMINATION:    Constitutional: Well-nourished. No physical deformities. Normally developed. Good grooming.  Respiratory: No labored breathing, no use of accessory muscles.   Cardiovascular: Normal temperature, normal extremity pulses, no swelling, no varicosities.  Skin: No paleness, no jaundice, no cyanosis. No lesion, no ulcer, no rash.  Neurologic / Psychiatric: Oriented to time, oriented to place, oriented to person. No depression, no anxiety, no agitation.  Gastrointestinal: No mass, no tenderness, no rigidity, Morbidly obese abdomen. no CVA tenderness bilaterally  Eyes: Normal conjunctivae. Normal eyelids.  Musculoskeletal: Normal gait and station of head and neck.     Complexity of Data:  Source Of History:  Patient  Records Review:   Previous Doctor Records, Previous Patient Records  X-Ray Review: C.T. Abdomen/Pelvis: Reviewed Films. Reviewed Report. Discussed With Patient.     PROCEDURES: None   ASSESSMENT:      ICD-10 Details  1 GU:   Ureteral calculus - N20.1 Undiagnosed New Problem  2   Renal colic - N23  Undiagnosed New Problem   PLAN:           Document Letter(s):  Created for Patient: Clinical Summary         Notes:   he would like to proceed with right ureteroscopy with laser lithotripsy and ureteral stent placement. He understands potential for bleeding, infection, injury to surrounding structures, ureteral avulsion, need for additional procedures, need for a staged procedure. We will proceed with  this tonight.   Signed by Modena Slater, III, M.D. on 03/04/21 at 4:28 PM (EDT

## 2021-03-04 NOTE — ED Notes (Signed)
Discharge instructions reviewed and explained, pt verbalized understanding.  ?

## 2021-03-04 NOTE — Addendum Note (Signed)
Addendum  created 03/04/21 2012 by Lowella Curb, MD   Alternative orders not taken and original order placed, Order list changed, Order sets accessed, Pharmacy for encounter modified

## 2021-03-04 NOTE — Discharge Summary (Signed)
Physician Discharge Summary  Patient ID: Justin Alvarez MRN: [id number] DOB/AGE: May 18, 1976 45 y.o.  Admit date: 03/04/2021 Discharge date: 03/04/2021  Admission Diagnoses:  Discharge Diagnoses:  Active Problems:   * No active hospital problems. *   Discharged Condition: good  Hospital Course: Patient underwent right ureteroscopy with laser lithotripsy and ureteral stent placement.  Patient tolerated the procedure well and was stable postoperative.  Consults: None  Significant Diagnostic Studies: None  Treatments: surgery: As above  Discharge Exam: Blood pressure (!) 189/91, pulse 65, temperature 98.3 F (36.8 C), resp. rate 18, height 5\' 11"  (1.803 m), weight (!) 180.7 kg, SpO2 95 %. General appearance: alert no acute distress Adequate perfusion of extremities Nonlabored respiration, symmetrical chest rise  Disposition: Discharge disposition: 01-Home or Self Care        Allergies as of 03/04/2021      Reactions   Ace Inhibitors Cough      Medication List    TAKE these medications   ALPRAZolam 0.5 MG tablet Commonly known as: XANAX Take 0.5 mg by mouth daily as needed for anxiety. For anxiety   cephALEXin 500 MG capsule Commonly known as: KEFLEX Take 1 capsule (500 mg total) by mouth 4 (four) times daily.   Dexilant 30 MG capsule Generic drug: Dexlansoprazole Take 30 mg by mouth daily.   DULoxetine 60 MG capsule Commonly known as: CYMBALTA Take 60 mg by mouth daily.   HYDROcodone-acetaminophen 5-325 MG tablet Commonly known as: NORCO/VICODIN Take 1 tablet by mouth every 6 (six) hours as needed for moderate pain or severe pain.   losartan 50 MG tablet Commonly known as: COZAAR Take 50 mg by mouth daily.   metoprolol succinate 50 MG 24 hr tablet Commonly known as: TOPROL-XL Take 50 mg by mouth daily.   oxyCODONE-acetaminophen 5-325 MG tablet Commonly known as: Percocet Take 1 tablet by mouth every 4 (four) hours as needed for up to 3 days for severe  pain.   silver sulfADIAZINE 1 % cream Commonly known as: SILVADENE Apply 1 application topically daily.   tamsulosin 0.4 MG Caps capsule Commonly known as: FLOMAX Take 1 capsule (0.4 mg total) by mouth daily.   vitamin C 1000 MG tablet Take 1,000 mg by mouth daily. Reported on 05/07/2016        Signed: 05/09/2016, III 03/04/2021, 10:10 PM

## 2021-03-04 NOTE — Op Note (Signed)
Operative Note  Preoperative diagnosis:  1.  Right ureteral calculus  Postoperative diagnosis: 1.  Right ureteral calculus  Procedure(s): 1.  Cystoscopy with right retrograde pyelogram, right ureteroscopy with laser lithotripsy, ureteral stent placement  Surgeon: Modena Slater, MD  Assistants: None  Anesthesia: General  Complications: None immediate  EBL: Minimal  Specimens: 1.  None  Drains/Catheters: 1.  6 x 26 double-J ureteral stent  Intraoperative findings: 1.  Normal anterior urethra 2.  Nonobstructing prostate 3.  Normal bladder mucosa 3.  Right retrograde pyelogram revealed moderate right-sided hydronephrosis.  No obvious filling defect after fragmentation of the stone.  Ureteroscopy revealed an approximately a millimeter calculus that was fragmented to tiny fragments.  Indication: 45 year old male with right-sided flank pain refractory to pain medication and a right ureteral calculus presents for the previously mentioned operation.  Description of procedure:  The patient was identified and consent was obtained.  The patient was taken to the operating room and placed in the supine position.  The patient was placed under general anesthesia.  Perioperative antibiotics were administered.  The patient was placed in dorsal lithotomy.  Patient was prepped and draped in a standard sterile fashion and a timeout was performed.  A 21 French rigid scope was advanced into the urethra and into the bladder.  Complete cystoscopy was performed with no abnormal findings.  The right ureter was cannulated with a wire which was advanced up to the kidney under fluoroscopic guidance.  Semirigid ureteroscopy was performed alongside the wire up to the stone of interest which was in the very proximal portion of the ureter and retropulsed into the kidney.  A second wire was advanced through the scope and into the kidney and the scope withdrawn.  1 the wires was secured to the drape as a safety wire.   The other wire was used to advance a 12 x 14 ureteral access sheath over the wire under continuous fluoroscopic guidance.  The inner sheath along with the wire were withdrawn.  Ureteroscopy was performed that identified the stone of interest which was fragmented on dust settings to tiny fragments.  No other clinically significant stone fragments remained after complete pyeloscopy.  I shot a retrograde pyelogram through the scope with the findings noted above.  I withdrew the scope along with the access sheath visualizing the entire ureter upon removal.  No other ureteral calculi were seen.  No obvious ureteral injury was seen.  Wire was backloaded onto a rigid scope and advanced that into the bladder followed by routine placement of a 6 x 26 double-J ureteral stent.  Fluoroscopy confirmed proximal placement and direct visualization confirmed a good coil within the bladder.  I drained the bladder and withdrew the scope.  Patient tolerated the procedure well and was stable postoperative.  Plan: Follow-up in 1 week for stent removal

## 2021-03-04 NOTE — Anesthesia Procedure Notes (Signed)
Procedure Name: LMA Insertion Performed by: Sudie Grumbling, CRNA Pre-anesthesia Checklist: Patient identified, Emergency Drugs available, Suction available and Patient being monitored Patient Re-evaluated:Patient Re-evaluated prior to induction Oxygen Delivery Method: Circle system utilized Preoxygenation: Pre-oxygenation with 100% oxygen Induction Type: IV induction Ventilation: Mask ventilation without difficulty LMA: LMA with gastric port inserted LMA Size: 4.5 Tube type: Oral Number of attempts: 1 Placement Confirmation: ETT inserted through vocal cords under direct vision,  positive ETCO2 and breath sounds checked- equal and bilateral Tube secured with: Tape Dental Injury: Teeth and Oropharynx as per pre-operative assessment

## 2021-03-04 NOTE — ED Provider Notes (Signed)
[facility] EMERGENCY DEPARTMENT Provider Note   CSN: [id number] Arrival date & time: 03/04/21  4315     History Chief Complaint  Patient presents with  . Back Pain    Justin Alvarez is a 45 y.o. male presenting to emergency department with R lower back pain. Patient reports he had a physical last week for work that revealed microscopic hematuria. States he has had some intermittent pain over the last few days and had a CT scan done yesterday. Mentions he took some Advil yesterday that relieved the pain. However, today reports pain is severe, resistant to Advil. He mentions some burning with urination, darker colored urine, but no frank hematuria. He also reports some nausea this morning, no vomiting. Denies fevers, chills, chest pain, dyspnea, abdominal pain, diarrhea, constipation. Mentions he possibly had previous kidney stones, but pain has never felt like this. Of note, mentions he has not taken his home medications today.     Past Medical History:  Diagnosis Date  . Anxiety   . Depression   . GERD (gastroesophageal reflux disease)   . Glucose intolerance (impaired glucose tolerance)   . HTN (hypertension)   . OCD (obsessive compulsive disorder)     Patient Active Problem List   Diagnosis Date Noted  . Morbid obesity (HCC) 12/10/2014  . Essential hypertension 12/10/2014   History reviewed. No pertinent surgical history.    Family History  Problem Relation Age of Onset  . Hypertension Mother   . Hypertension Father   . Heart disease Brother    Social History   Tobacco Use  . Smoking status: Never Smoker  . Smokeless tobacco: Never Used  Vaping Use  . Vaping Use: Never used  Substance Use Topics  . Alcohol use: No    Alcohol/week: 0.0 standard drinks  . Drug use: No   Home Medications Prior to Admission medications   Medication Sig Start Date End Date Taking? Authorizing Provider  ALPRAZolam Prudy Feeler) 0.5 MG tablet Take 0.5 mg by mouth daily as  needed. For anxiety 09/03/14   [provider]  Ascorbic Acid (VITAMIN C) 1000 MG tablet Take 1,000 mg by mouth daily. Reported on 05/07/2016    [provider]  cephALEXin (KEFLEX) 500 MG capsule Take 1 capsule (500 mg total) by mouth 4 (four) times daily. 08/15/19   Eustace Moore, MD  DEXILANT 30 MG capsule Take 30 mg by mouth daily. 12/01/14   [provider]  DULoxetine (CYMBALTA) 60 MG capsule Take 60 mg by mouth daily. 10/28/14   [provider]  losartan (COZAAR) 50 MG tablet Take 50 mg by mouth daily. 11/18/14   [provider]  metoprolol succinate (TOPROL-XL) 50 MG 24 hr tablet Take 50 mg by mouth daily. 12/04/14   [provider]  Omega-3 Fatty Acids (FISH OIL) 1000 MG CAPS Take 1,000 mg by mouth daily. Reported on 05/07/2016    [provider]  silver sulfADIAZINE (SILVADENE) 1 % cream Apply 1 application topically daily. 03/25/20   Moshe Cipro, NP   Allergies    Ace inhibitors  Review of Systems   Review of Systems  Constitutional: Negative for chills, fatigue and fever.  Respiratory: Negative for cough, chest tightness and shortness of breath.   Cardiovascular: Negative for chest pain.  Gastrointestinal: Positive for nausea. Negative for abdominal pain, diarrhea and vomiting.  Genitourinary: Positive for flank pain. Negative for dysuria.   Physical Exam Updated Vital Signs BP (!) 169/106   Pulse 63  Temp (!) 97.4 F (36.3 C) (Oral)   Resp (!) 24   SpO2 100%   Physical Exam Constitutional:      General: He is awake.     Appearance: He is overweight. He is not toxic-appearing or diaphoretic.     Comments: Crying in pain, moderate distress  HENT:     Head: Normocephalic and atraumatic.  Cardiovascular:     Rate and Rhythm: Normal rate and regular rhythm.     Pulses: Normal pulses.     Heart sounds: Normal heart sounds.  Pulmonary:     Effort: Pulmonary effort is normal.     Breath sounds: Normal  breath sounds.  Abdominal:     General: Bowel sounds are normal. There is no distension.     Palpations: Abdomen is soft.     Tenderness: There is no abdominal tenderness.  Musculoskeletal:     Right lower leg: No edema.     Left lower leg: No edema.  Skin:    General: Skin is warm and dry.  Neurological:     Mental Status: He is alert.  Psychiatric:        Mood and Affect: Mood is anxious.        Behavior: Behavior is cooperative.        Thought Content: Thought content normal.    ED Results / Procedures / Treatments   Labs (all labs ordered are listed, but only abnormal results are displayed) Labs Reviewed  BASIC METABOLIC PANEL - Abnormal; Notable for the following components:      Result Value   Glucose, Bld 136 (*)    All other components within normal limits  URINE CULTURE  CBC WITH DIFFERENTIAL/PLATELET  URINALYSIS, ROUTINE W REFLEX MICROSCOPIC   EKG None  Radiology CT ABDOMEN PELVIS WO CONTRAST  Result Date: 03/03/2021 CLINICAL DATA:  45 year old male with right flank pain and hematuria. EXAM: CT ABDOMEN AND PELVIS WITHOUT CONTRAST TECHNIQUE: Multidetector CT imaging of the abdomen and pelvis was performed following the standard protocol without IV contrast. COMPARISON:  Right upper quadrant ultrasound dated 05/07/2016. FINDINGS: Evaluation of this exam is limited in the absence of intravenous contrast. Lower chest: The visualized lung bases are clear. No intra-abdominal free air or free fluid. Hepatobiliary: Fatty liver. No intrahepatic biliary dilatation. Multiple gallstones. No pericholecystic fluid or evidence of acute cholecystitis by CT. Pancreas: Unremarkable. No pancreatic ductal dilatation or surrounding inflammatory changes. Spleen: Normal in size without focal abnormality. Adrenals/Urinary Tract: The adrenal glands unremarkable. There is a 1 cm linear stone in the proximal right ureter close to the ureteropelvic junction. There is mild fullness of the right  renal collecting system. The left kidney is unremarkable. The urinary bladder is unremarkable. Stomach/Bowel: There is no bowel obstruction or active inflammation. The appendix is normal. Vascular/Lymphatic: The abdominal aorta and IVC unremarkable. No portal venous gas. There is no adenopathy. Reproductive: The prostate and seminal vesicles are grossly unremarkable. No pelvic mass. Other: None Musculoskeletal: Degenerative changes of the spine. No acute osseous pathology. IMPRESSION: 1. A 1 cm linear stone in the proximal right ureter with mild fullness of the right renal collecting system. 2. Cholelithiasis. 3. Fatty liver. 4. No bowel obstruction. Normal appendix. Electronically Signed   By: Elgie Collard M.D.   On: 03/03/2021 22:22   Procedures None  Medications Ordered in ED Medications  ketorolac (TORADOL) 15 MG/ML injection 15 mg (15 mg Intravenous Given 03/04/21 0949)  ketorolac (TORADOL) 15 MG/ML injection 15 mg (15 mg Intravenous Given  03/04/21 1009)  morphine 4 MG/ML injection 4 mg (4 mg Intravenous Given 03/04/21 1040)  ondansetron (ZOFRAN) injection 4 mg (4 mg Intravenous Given 03/04/21 1130)  HYDROmorphone (DILAUDID) injection 1 mg (1 mg Intravenous Given 03/04/21 1132)   ED Course  I have reviewed the triage vital signs and the nursing notes.  Pertinent labs & imaging results that were available during my care of the patient were reviewed by me and considered in my medical decision making (see chart for details).    MDM Rules/Calculators/A&P                         Patient presenting with urolithiasis (1cm stone in R ureteropelvic junction), as noted by CT completed yesterday. CBC, BMP within normal limits. Despite toradol and morphine, continues to be in pain. Will discuss with urology.   Discussed with Dr. Alvester Morin with urology, recommends discharge from the ED with plans to see Mr. Chillemi this afternoon, scheduled by their RN. Will obtain COVID test in case patient will need surgical  intervention. Patient's pain has significantly improved, will discharge with pain medications.  Final Clinical Impression(s) / ED Diagnoses Final diagnoses:  Calculus of ureter   Rx / DC Orders ED Discharge Orders         Ordered    oxyCODONE-acetaminophen (PERCOCET) 5-325 MG tablet  Every 4 hours PRN        03/04/21 1245           Evlyn Kanner, MD 03/04/21 1245    Gwyneth Sprout, MD 03/04/21 1318

## 2021-03-04 NOTE — Transfer of Care (Signed)
Immediate Anesthesia Transfer of Care Note  Patient: Justin Alvarez  Procedure(s) Performed: CYSTOSCOPY/URETEROSCOPY/HOLMIUM LASER/STENT PLACEMENT (Right )  Patient Location: PACU  Anesthesia Type:General  Level of Consciousness: sedated, drowsy, patient cooperative and responds to stimulation  Airway & Oxygen Therapy: Patient Spontanous Breathing and Patient connected to face mask oxygen  Post-op Assessment: Report given to RN and Post -op Vital signs reviewed and stable  Post vital signs: Reviewed and stable  Last Vitals:  Vitals Value Taken Time  BP    Temp    Pulse    Resp    SpO2      Last Pain:  Vitals:   03/04/21 1751  TempSrc:   PainSc: 0-No pain      Patients Stated Pain Goal: 3 (03/04/21 1745)  Complications: No complications documented.

## 2021-03-04 NOTE — ED Triage Notes (Signed)
Pt reports right sided lower back pain for the past 3 days, worse today. Pt tearful in triage. Pt had an outpatient CT scan done yesterday to rule out kidney stones but has not received his results yet.

## 2021-03-04 NOTE — Discharge Instructions (Addendum)
General Anesthesia, Adult, Care After This sheet gives you information about how to care for yourself after your procedure. Your health care provider may also give you more specific instructions. If you have problems or questions, contact your health care provider. What can I expect after the procedure? After the procedure, the following side effects are common:  Pain or discomfort at the IV site.  Nausea.  Vomiting.  Sore throat.  Trouble concentrating.  Feeling cold or chills.  Feeling weak or tired.  Sleepiness and fatigue.  Soreness and body aches. These side effects can affect parts of the body that were not involved in surgery. Follow these instructions at home: For the time period you were told by your health care provider:  Rest.  Do not participate in activities where you could fall or become injured.  Do not drive or use machinery.  Do not drink alcohol.  Do not take sleeping pills or medicines that cause drowsiness.  Do not make important decisions or sign legal documents.  Do not take care of children on your own.   Eating and drinking  Follow any instructions from your health care provider about eating or drinking restrictions.  When you feel hungry, start by eating small amounts of foods that are soft and easy to digest (bland), such as toast. Gradually return to your regular diet.  Drink enough fluid to keep your urine pale yellow.  If you vomit, rehydrate by drinking water, juice, or clear broth. General instructions  If you have sleep apnea, surgery and certain medicines can increase your risk for breathing problems. Follow instructions from your health care provider about wearing your sleep device: ? Anytime you are sleeping, including during daytime naps. ? While taking prescription pain medicines, sleeping medicines, or medicines that make you drowsy.  Have a responsible adult stay with you for the time you are told. It is important to have  someone help care for you until you are awake and alert.  Return to your normal activities as told by your health care provider. Ask your health care provider what activities are safe for you.  Take over-the-counter and prescription medicines only as told by your health care provider.  If you smoke, do not smoke without supervision.  Keep all follow-up visits as told by your health care provider. This is important. Contact a health care provider if:  You have nausea or vomiting that does not get better with medicine.  You cannot eat or drink without vomiting.  You have pain that does not get better with medicine.  You are unable to pass urine.  You develop a skin rash.  You have a fever.  You have redness around your IV site that gets worse. Get help right away if:  You have difficulty breathing.  You have chest pain.  You have blood in your urine or stool, or you vomit blood. Summary  After the procedure, it is common to have a sore throat or nausea. It is also common to feel tired.  Have a responsible adult stay with you for the time you are told. It is important to have someone help care for you until you are awake and alert.  When you feel hungry, start by eating small amounts of foods that are soft and easy to digest (bland), such as toast. Gradually return to your regular diet.  Drink enough fluid to keep your urine pale yellow.  Return to your normal activities as told by your health care provider.   Ask your health care provider what activities are safe for you. This information is not intended to replace advice given to you by your health care provider. Make sure you discuss any questions you have with your health care provider. Document Revised: 07/15/2020 Document Reviewed: 02/12/2020 Elsevier Patient Education  2021 [employer] [facility] [phone] Post Ureteroscopy With or Without Stent Instructions  Definitions:  Ureter: The duct  that transports urine from the kidney to the bladder. Stent:   A plastic hollow tube that is placed into the ureter, from the kidney to the                 bladder to prevent the ureter from swelling shut.  GENERAL INSTRUCTIONS:  Despite the fact that no skin incisions were used, the area around the ureter and bladder is raw and irritated. The stent is a foreign body which will further irritate the bladder wall. This irritation is manifested by increased frequency of urination, both day and night, and by an increase in the urge to urinate. In some, the urge to urinate is present almost always. Sometimes the urge is strong enough that you may not be able to stop yourself from urinating. The only real cure is to remove the stent and then give time for the bladder wall to heal which can't be done until the danger of the ureter swelling shut has passed, which varies.  You may see some blood in your urine while the stent is in place and a few days afterwards. Do not be alarmed, even if the urine was clear for a while. Get off your feet and drink lots of fluids until clearing occurs. If you start to pass clots or don't improve, call us.  DIET: You may return to your normal diet immediately. Because of the raw surface of your bladder, alcohol, spicy foods, acid type foods and drinks with caffeine may cause irritation or frequency and should be used in moderation. To keep your urine flowing freely and to avoid constipation, drink plenty of fluids during the day ( 8-10 glasses ). Tip: Avoid cranberry juice because it is very acidic.  ACTIVITY: Your physical activity doesn't need to be restricted. However, if you are very active, you may see some blood in your urine. We suggest that you reduce your activity under these circumstances until the bleeding has stopped.  BOWELS: It is important to keep your bowels regular during the postoperative period. Straining with bowel movements can cause bleeding. A bowel  movement every other day is reasonable. Use a mild laxative if needed, such as Milk of Magnesia 2-3 tablespoons, or 2 Dulcolax tablets. Call if you continue to have problems. If you have been taking narcotics for pain, before, during or after your surgery, you may be constipated. Take a laxative if necessary.   MEDICATION: You should resume your pre-surgery medications unless told not to. You may take oxybutynin or flomax if prescribed for bladder spasms or discomfort from the stent Take pain medication as directed for pain refractory to conservative management  PROBLEMS YOU SHOULD REPORT TO US:  Fevers over 100.5 Fahrenheit.  Heavy bleeding, or clots ( See above notes about blood in urine ).  Inability to urinate.  Drug reactions ( hives, rash, nausea, vomiting, diarrhea ).  Severe burning or pain with urination that is not improving.  

## 2021-03-04 NOTE — Anesthesia Postprocedure Evaluation (Signed)
Anesthesia Post Note  Patient: Justin Alvarez  Procedure(s) Performed: CYSTOSCOPY/URETEROSCOPY/HOLMIUM LASER/STENT PLACEMENT (Right )     Patient location during evaluation: PACU Anesthesia Type: General Level of consciousness: awake and alert Pain management: pain level controlled Vital Signs Assessment: post-procedure vital signs reviewed and stable Respiratory status: spontaneous breathing, nonlabored ventilation and respiratory function stable Cardiovascular status: blood pressure returned to baseline and stable Postop Assessment: no apparent nausea or vomiting Anesthetic complications: no   No complications documented.  Last Vitals:  Vitals:   03/04/21 1722 03/04/21 1945  BP: (!) 189/91   Pulse: 65   Resp: 16   Temp: 37.3 C 36.8 C  SpO2: 98%     Last Pain:  Vitals:   03/04/21 1751  TempSrc:   PainSc: 0-No pain                 Lowella Curb

## 2021-03-05 ENCOUNTER — Encounter (HOSPITAL_COMMUNITY): Payer: Self-pay | Admitting: Urology

## 2021-03-18 ENCOUNTER — Other Ambulatory Visit: Payer: 59

## 2021-04-14 ENCOUNTER — Ambulatory Visit
Admission: EM | Admit: 2021-04-14 | Discharge: 2021-04-14 | Disposition: A | Discharge status: Home / Self Care | Payer: 59 | Attending: Emergency Medicine | Admitting: Emergency Medicine

## 2021-04-14 ENCOUNTER — Encounter: Payer: Self-pay | Admitting: Emergency Medicine

## 2021-04-14 DIAGNOSIS — Z23 Encounter for immunization: Secondary | ICD-10-CM | POA: Diagnosis not present

## 2021-04-14 DIAGNOSIS — S81812A Laceration without foreign body, left lower leg, initial encounter: Secondary | ICD-10-CM

## 2021-04-14 MED ORDER — TETANUS-DIPHTH-ACELL PERTUSSIS 5-2.5-18.5 LF-MCG/0.5 IM SUSY
0.5000 mL | PREFILLED_SYRINGE | Freq: Once | INTRAMUSCULAR | Status: AC
  Administered 2021-04-14: 0.5 mL via INTRAMUSCULAR

## 2021-04-14 NOTE — ED Triage Notes (Signed)
Patient states that he was cutting with steel circular saw and a piece came back on his left lower leg.  Bleeding and pain.  Tdap less than 45 years old.

## 2021-04-14 NOTE — ED Provider Notes (Signed)
[facility]    CSN: [id number] Arrival date & time: 04/14/21  1222      History   Chief Complaint Chief Complaint  Patient presents with  . Laceration    HPI Justin Alvarez is a 45 y.o. male presenting today for evaluation of leg laceration.  Sustained laceration to left lower leg earlier today while cutting with a circular saw.  Piece came back and hit his lower leg.  Reports tetanus 2016. Denies difficulty moving ankle/foot or toes on left side.Marland Kitchen   HPI  Past Medical History:  Diagnosis Date  . Anxiety   . Depression   . GERD (gastroesophageal reflux disease)   . Glucose intolerance (impaired glucose tolerance)   . HTN (hypertension)   . OCD (obsessive compulsive disorder)     Patient Active Problem List   Diagnosis Date Noted  . Morbid obesity (HCC) 12/10/2014  . Essential hypertension 12/10/2014    Past Surgical History:  Procedure Laterality Date  . CYSTOSCOPY/URETEROSCOPY/HOLMIUM LASER/STENT PLACEMENT Right 03/04/2021   Procedure: CYSTOSCOPY/URETEROSCOPY/HOLMIUM LASER/STENT PLACEMENT;  Surgeon: Crista Elliot, MD;  Location: [facility];  Service: Urology;  Laterality: Right;       Home Medications    Prior to Admission medications   Medication Sig Start Date End Date Taking? Authorizing Provider  ALPRAZolam Prudy Feeler) 0.5 MG tablet Take 0.5 mg by mouth daily as needed for anxiety. For anxiety 09/03/14  Yes [provider]  Ascorbic Acid (VITAMIN C) 1000 MG tablet Take 1,000 mg by mouth daily. Reported on 05/07/2016   Yes [provider]  DEXILANT 30 MG capsule Take 30 mg by mouth daily. 12/01/14  Yes [provider]  DULoxetine (CYMBALTA) 60 MG capsule Take 60 mg by mouth daily. 10/28/14  Yes [provider]  losartan (COZAAR) 50 MG tablet Take 50 mg by mouth daily. 11/18/14  Yes [provider]  metoprolol succinate (TOPROL-XL) 50 MG 24 hr tablet Take 50 mg by mouth daily. 12/04/14  Yes [provider]   tamsulosin (FLOMAX) 0.4 MG CAPS capsule Take 1 capsule (0.4 mg total) by mouth daily. 03/04/21  Yes Ray Church III, MD  cephALEXin (KEFLEX) 500 MG capsule Take 1 capsule (500 mg total) by mouth 4 (four) times daily. Patient not taking: Reported on 03/04/2021 08/15/19   Eustace Moore, MD  HYDROcodone-acetaminophen (NORCO/VICODIN) 5-325 MG tablet Take 1 tablet by mouth every 6 (six) hours as needed for moderate pain or severe pain. 03/04/21   [provider]  silver sulfADIAZINE (SILVADENE) 1 % cream Apply 1 application topically daily. Patient not taking: No sig reported 03/25/20   Moshe Cipro, NP    Family History Family History  Problem Relation Age of Onset  . Hypertension Mother   . Hypertension Father   . Heart disease Brother     Social History Social History   Tobacco Use  . Smoking status: Never Smoker  . Smokeless tobacco: Never Used  Vaping Use  . Vaping Use: Never used  Substance Use Topics  . Alcohol use: No    Alcohol/week: 0.0 standard drinks  . Drug use: No     Allergies   Ace inhibitors   Review of Systems Review of Systems  Constitutional: Negative for fatigue and fever.  Eyes: Negative for redness, itching and visual disturbance.  Respiratory: Negative for shortness of breath.   Cardiovascular: Negative for chest pain and leg swelling.  Gastrointestinal: Negative for nausea and vomiting.  Musculoskeletal: Negative for arthralgias and myalgias.  Skin: Positive for color change and wound. Negative for rash.  Neurological: Negative for dizziness, syncope, weakness, light-headedness and headaches.     Physical Exam Triage Vital Signs ED Triage Vitals  Enc Vitals Group     BP 04/14/21 1259 (!) 149/87     Pulse Rate 04/14/21 1259 84     Resp --      Temp 04/14/21 1259 98.3 F (36.8 C)     Temp Source 04/14/21 1259 Oral     SpO2 04/14/21 1259 97 %     Weight --      Height --      Head Circumference --      Peak Flow --       Pain Score 04/14/21 1301 0     Pain Loc --      Pain Edu? --      Excl. in GC? --    No data found.  Updated Vital Signs BP (!) 149/87 (BP Location: Left Arm)   Pulse 84   Temp 98.3 F (36.8 C) (Oral)   SpO2 97%   Visual Acuity Right Eye Distance:   Left Eye Distance:   Bilateral Distance:    Right Eye Near:   Left Eye Near:    Bilateral Near:     Physical Exam Vitals and nursing note reviewed.  Constitutional:      Appearance: He is well-developed.     Comments: No acute distress  HENT:     Head: Normocephalic and atraumatic.     Nose: Nose normal.  Eyes:     Conjunctiva/sclera: Conjunctivae normal.  Cardiovascular:     Rate and Rhythm: Normal rate.  Pulmonary:     Effort: Pulmonary effort is normal. No respiratory distress.  Abdominal:     General: There is no distension.  Musculoskeletal:        General: Normal range of motion.     Cervical back: Neck supple.  Skin:    General: Skin is warm and dry.     Comments: 6 cm linear elliptical laceration noted to anterior left lower leg exposing subcutaneous fat  Neurological:     Mental Status: He is alert and oriented to person, place, and time.      UC Treatments / Results  Labs (all labs ordered are listed, but only abnormal results are displayed) Labs Reviewed - No data to display  EKG   Radiology No results found.  Procedures Laceration Repair  Date/Time: 04/14/2021 2:44 PM Performed by: Reginald Mangels, [place] C, PA-C Authorized by: Bobbijo Holst, [place] C, PA-C   Consent:    Consent obtained:  Verbal   Consent given by:  Patient   Risks, benefits, and alternatives were discussed: yes     Risks discussed:  Infection   Alternatives discussed:  No treatment Universal protocol:    Patient identity confirmed:  Verbally with patient Anesthesia:    Anesthesia method:  Local infiltration   Local anesthetic:  Lidocaine 2% WITH epi Laceration details:    Location:  Leg   Leg location:  L lower leg    Length (cm):  6 Pre-procedure details:    Preparation:  Patient was prepped and draped in usual sterile fashion Exploration:    Imaging outcome: foreign body not noted     Wound exploration: wound explored through full range of motion     Wound extent: no underlying fracture noted     Contaminated: no   Treatment:    Area cleansed with:  Shur-Clens  Amount of cleaning:  Standard   Irrigation solution:  Sterile water   Irrigation method:  Syringe   Visualized foreign bodies/material removed: no     Debridement:  None Skin repair:    Repair method:  Sutures   Suture material:  Prolene   Suture technique:  Horizontal mattress   Number of sutures:  5 Approximation:    Approximation:  Close Repair type:    Repair type:  Simple Post-procedure details:    Dressing:  Non-adherent dressing and antibiotic ointment   Procedure completion:  Tolerated well, no immediate complications   (including critical care time)  Medications Ordered in UC Medications  Tdap (BOOSTRIX) injection 0.5 mL (0.5 mLs Intramuscular Given 04/14/21 1354)    Initial Impression / Assessment and Plan / UC Course  I have reviewed the triage vital signs and the nursing notes.  Pertinent labs & imaging results that were available during my care of the patient were reviewed by me and considered in my medical decision making (see chart for details).     Laceration repaired-horizontal mattress placed instead of simple interrupted given middle of wound approximately 1.5 cm gaping and amount of tension to close wound.  5 place, sutures to be removed in 7 to 10 days, discussed wound care, monitor for any signs of infection.  No suspicion of underlying fracture or tendon injury. Discussed strict return precautions. Patient verbalized understanding and is agreeable with plan.   Final Clinical Impressions(s) / UC Diagnoses   Final diagnoses:  Laceration of left lower leg, initial encounter     Discharge Instructions      WOUND CARE Please return in 10 days to have your stitches/staples removed or sooner if you have concerns. Marland Kitchen Keep area clean and dry for 24 hours. Do not remove bandage, if applied. . After 24 hours, remove bandage and wash wound gently with mild soap and warm water. Reapply a new bandage after cleaning wound, if directed. . Continue daily cleansing with soap and water until stitches/staples are removed. . Do not apply any ointments or creams to the wound while stitches/staples are in place, as this may cause delayed healing. . Notify the office if you experience any of the following signs of infection: Swelling, redness, pus drainage, streaking, fever >101.0 F . Notify the office if you experience excessive bleeding that does not stop after 15-20 minutes of constant, firm pressure.    ED Prescriptions    None     PDMP not reviewed this encounter.   Lew Dawes, [place] 04/14/21 1447

## 2021-04-14 NOTE — Discharge Instructions (Signed)

## 2021-06-06 IMAGING — CT CT ABD-PELV W/O CM
1 of 2 series · 14 of 32 positions shown, 19 images · non-contrast
Comparison: Right upper quadrant ultrasound dated 05/07/2016.

CLINICAL DATA: 44-year-old male with right flank pain and
hematuria.

EXAM:
CT ABDOMEN AND PELVIS WITHOUT CONTRAST
TECHNIQUE: Multidetector CT imaging of the abdomen and pelvis was performed
following the standard protocol without IV contrast.

[Series 2: abd/pelvis w/(date) · axial · 1.27mm/px · z∈[+323,+788]mm · 14 of 107 slices shown, 19 images]
[im 7/107  soft-tissue]
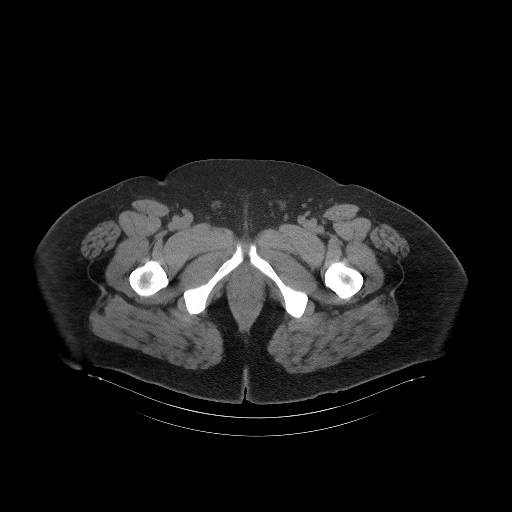
[im 7/107  bone]
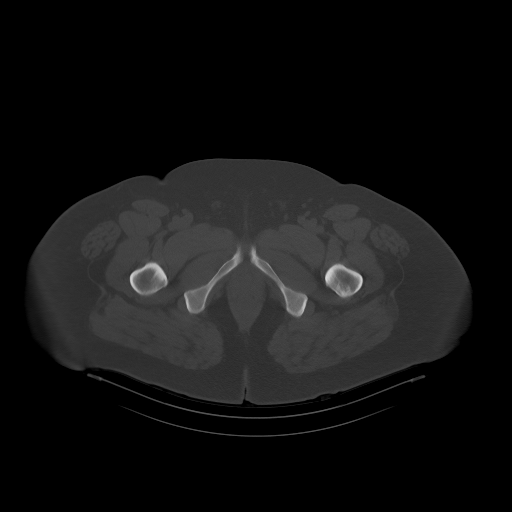
[im 13/107  soft-tissue]
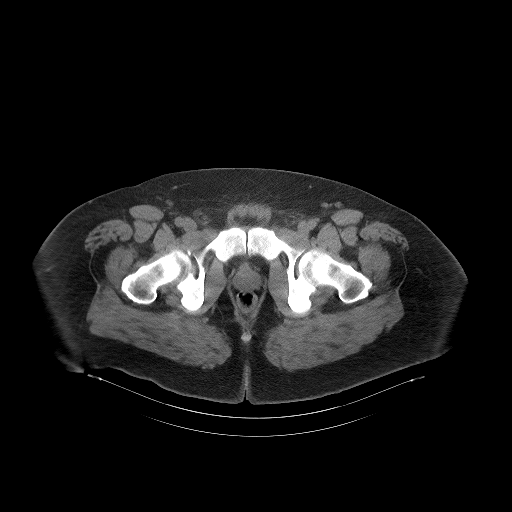
[im 25/107  soft-tissue]
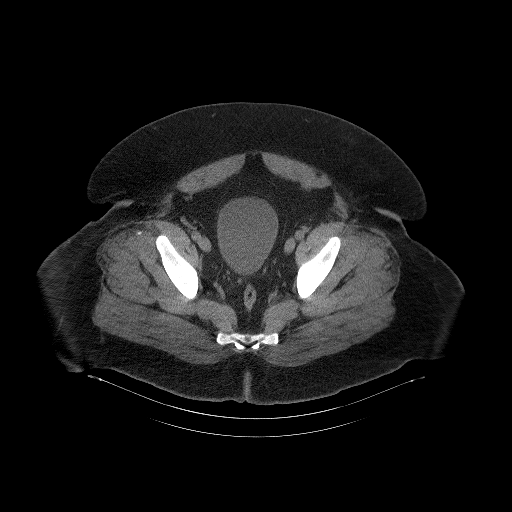
[im 32/107  soft-tissue]
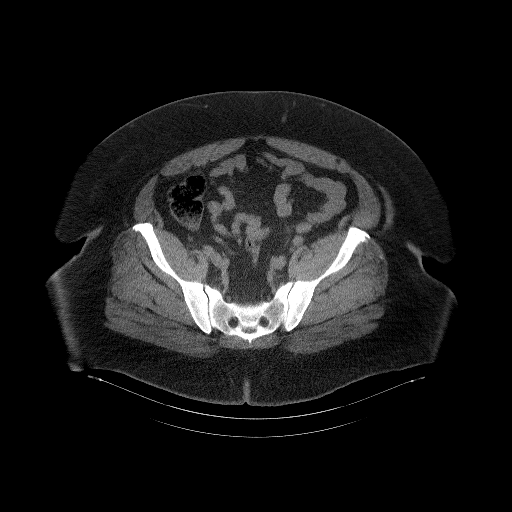
[im 38/107  soft-tissue]
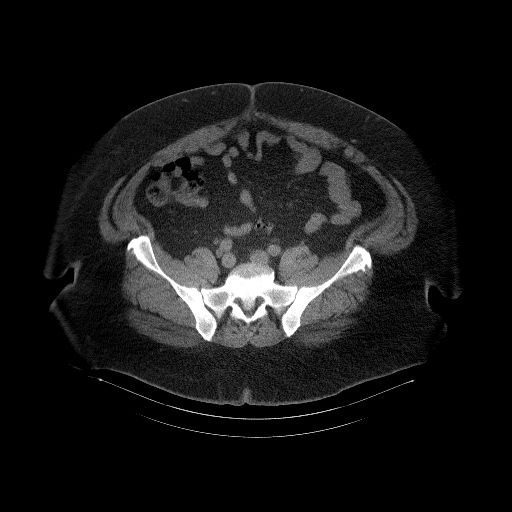
[im 44/107  soft-tissue]
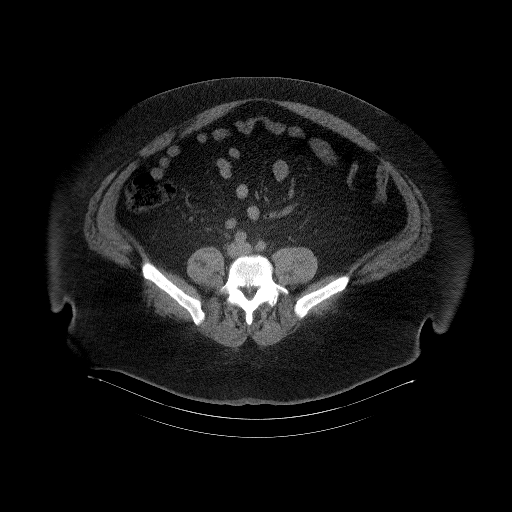
[im 57/107  soft-tissue]
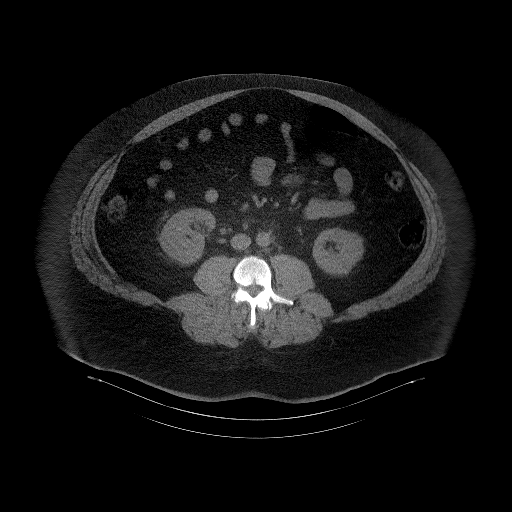
[im 63/107  soft-tissue]
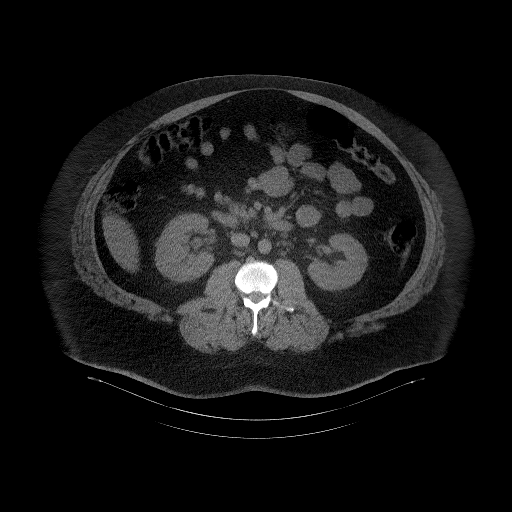
[im 69/107  soft-tissue]
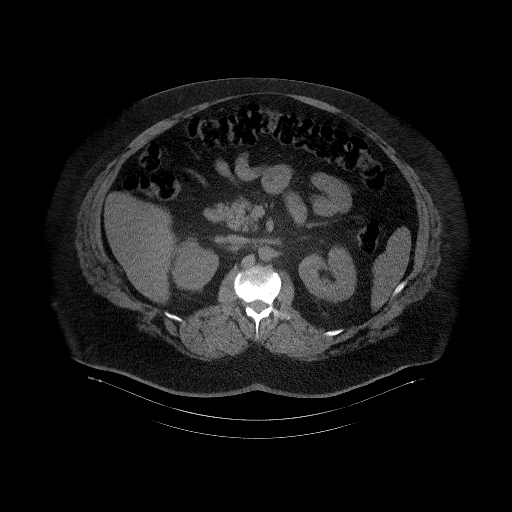
[im 69/107  bone]
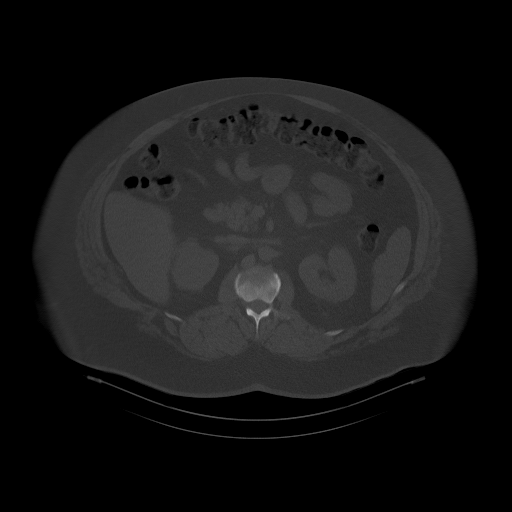
[im 75/107  soft-tissue]
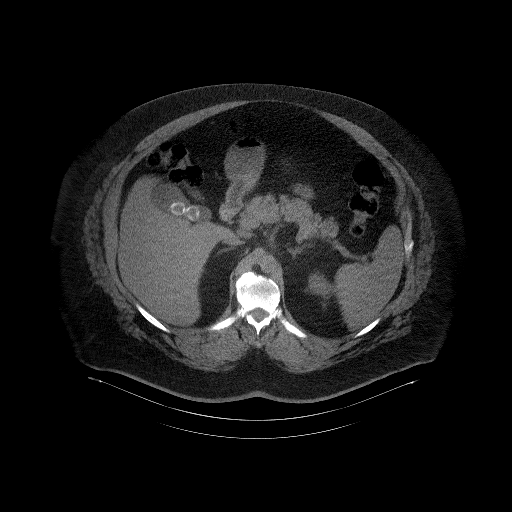
[im 82/107  soft-tissue]
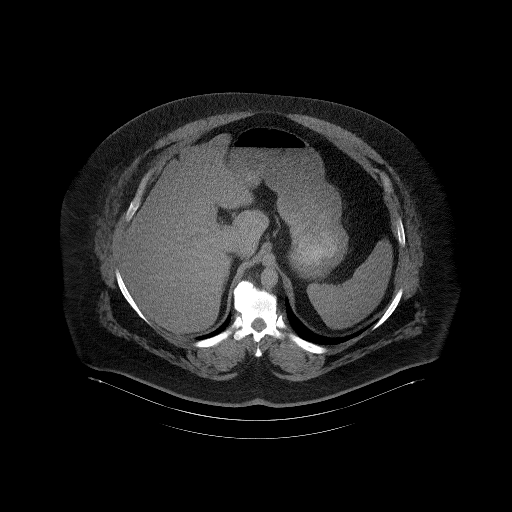
[im 82/107  lung]
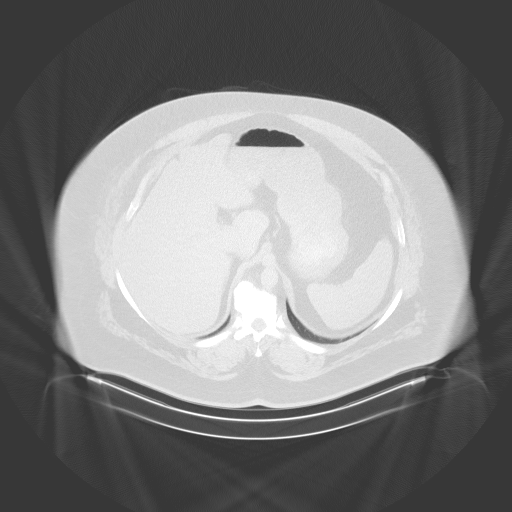
[im 88/107  lung]
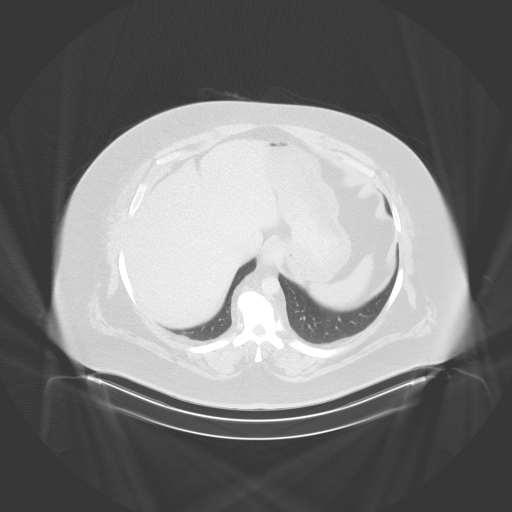
[im 94/107  soft-tissue]
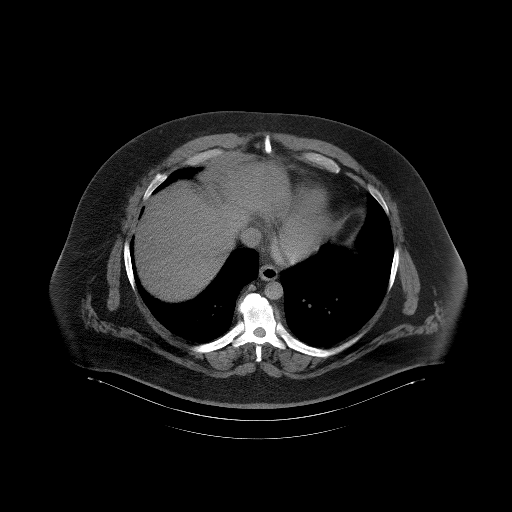
[im 94/107  lung]
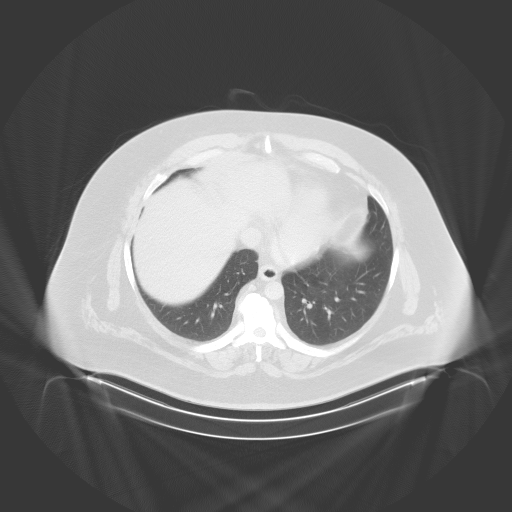
[im 100/107  soft-tissue]
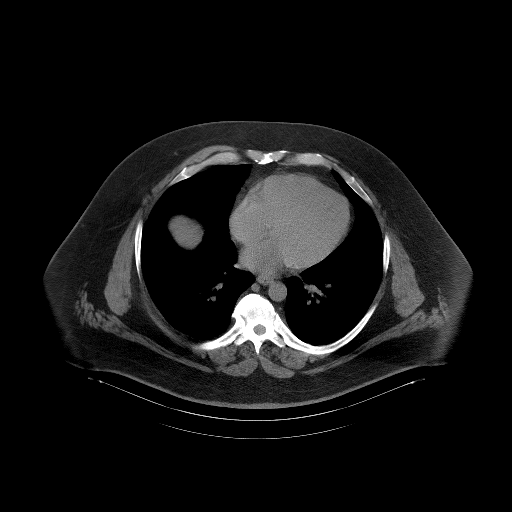
[im 100/107  lung]
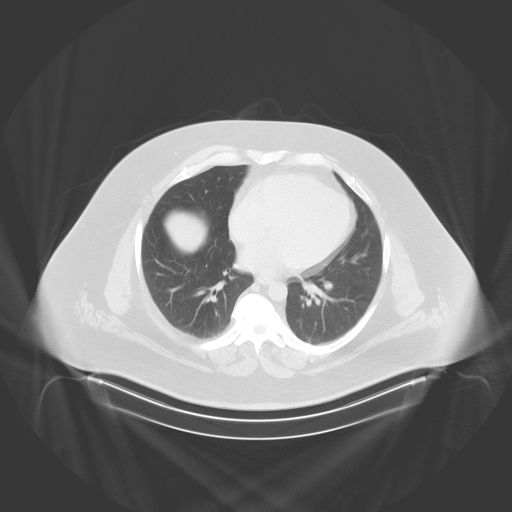

[14 of 32 positions shown; findings below may reference images not displayed]

FINDINGS: Evaluation of this exam is limited in the absence of intravenous
contrast.

Lower chest: The visualized lung bases are clear.

No intra-abdominal free air or free fluid.

Hepatobiliary: Fatty liver. No intrahepatic biliary dilatation.
Multiple gallstones. No pericholecystic fluid or evidence of acute
cholecystitis by CT.

Pancreas: Unremarkable. No pancreatic ductal dilatation or
surrounding inflammatory changes.

Spleen: Normal in size without focal abnormality.

Adrenals/Urinary Tract: The adrenal glands unremarkable. There is a
1 cm linear stone in the proximal right ureter close to the
ureteropelvic junction. There is mild fullness of the right renal
collecting system. The left kidney is unremarkable. The urinary
bladder is unremarkable.

Stomach/Bowel: There is no bowel obstruction or active inflammation.
The appendix is normal.

Vascular/Lymphatic: The abdominal aorta and IVC unremarkable. No
portal venous gas. There is no adenopathy.

Reproductive: The prostate and seminal vesicles are grossly
unremarkable. No pelvic mass.

Other: None

Musculoskeletal: Degenerative changes of the spine. No acute osseous
pathology.
IMPRESSION: 1. A 1 cm linear stone in the proximal right ureter with mild
fullness of the right renal collecting system.
2. Cholelithiasis.
3. Fatty liver.
4. No bowel obstruction. Normal appendix.

## 2022-05-21 ENCOUNTER — Encounter: Payer: Self-pay | Admitting: Emergency Medicine

## 2022-05-21 ENCOUNTER — Other Ambulatory Visit: Payer: Self-pay

## 2022-05-21 ENCOUNTER — Ambulatory Visit
Admission: EM | Admit: 2022-05-21 | Discharge: 2022-05-21 | Disposition: A | Discharge status: Home / Self Care | Payer: 59 | Attending: Physician Assistant | Admitting: Physician Assistant

## 2022-05-21 DIAGNOSIS — H6691 Otitis media, unspecified, right ear: Secondary | ICD-10-CM | POA: Diagnosis not present

## 2022-05-21 MED ORDER — AMOXICILLIN 500 MG PO CAPS: 500.0000 mg | ORAL_CAPSULE | Freq: Three times a day (TID) | ORAL | 0 refills | Status: AC

## 2022-05-21 NOTE — Discharge Instructions (Signed)
Return if any problems.

## 2022-05-21 NOTE — ED Triage Notes (Signed)
Pt here for right sided ear pain and swelling upon waking this am

## 2022-05-22 NOTE — ED Provider Notes (Signed)
[facility]    CSN: [id number] Arrival date & time: 05/21/22  1441      History   Chief Complaint Chief Complaint  Patient presents with   Otalgia    HPI Justin Alvarez is a 46 y.o. male.   The history is provided by the patient. No language interpreter was used.  Otalgia Location:  Right Behind ear:  Redness Quality:  Aching Severity:  Moderate Onset quality:  Gradual Duration:  1 day Progression:  Worsening Chronicity:  New Relieved by:  Nothing Worsened by:  Nothing Ineffective treatments:  None tried Associated symptoms: no vomiting     Past Medical History:  Diagnosis Date   Anxiety    Depression    GERD (gastroesophageal reflux disease)    Glucose intolerance (impaired glucose tolerance)    HTN (hypertension)    OCD (obsessive compulsive disorder)     Patient Active Problem List   Diagnosis Date Noted   Morbid obesity (HCC) 12/10/2014   Essential hypertension 12/10/2014    Past Surgical History:  Procedure Laterality Date   CYSTOSCOPY/URETEROSCOPY/HOLMIUM LASER/STENT PLACEMENT Right 03/04/2021   Procedure: CYSTOSCOPY/URETEROSCOPY/HOLMIUM LASER/STENT PLACEMENT;  Surgeon: Crista Elliot, MD;  Location: [facility];  Service: Urology;  Laterality: Right;       Home Medications    Prior to Admission medications   Medication Sig Start Date End Date Taking? Authorizing Provider  amoxicillin (AMOXIL) 500 MG capsule Take 1 capsule (500 mg total) by mouth 3 (three) times daily for 10 days. 05/21/22 05/31/22 Yes Elson Areas, PA-C  ALPRAZolam Prudy Feeler) 0.5 MG tablet Take 0.5 mg by mouth daily as needed for anxiety. For anxiety 09/03/14   [provider]  Ascorbic Acid (VITAMIN C) 1000 MG tablet Take 1,000 mg by mouth daily. Reported on 05/07/2016    [provider]  cephALEXin (KEFLEX) 500 MG capsule Take 1 capsule (500 mg total) by mouth 4 (four) times daily. Patient not taking: Reported on 03/04/2021 08/15/19   Eustace Moore,  MD  DEXILANT 30 MG capsule Take 30 mg by mouth daily. 12/01/14   [provider]  DULoxetine (CYMBALTA) 60 MG capsule Take 60 mg by mouth daily. 10/28/14   [provider]  HYDROcodone-acetaminophen (NORCO/VICODIN) 5-325 MG tablet Take 1 tablet by mouth every 6 (six) hours as needed for moderate pain or severe pain. 03/04/21   [provider]  losartan (COZAAR) 50 MG tablet Take 50 mg by mouth daily. 11/18/14   [provider]  metoprolol succinate (TOPROL-XL) 50 MG 24 hr tablet Take 50 mg by mouth daily. 12/04/14   [provider]  silver sulfADIAZINE (SILVADENE) 1 % cream Apply 1 application topically daily. Patient not taking: No sig reported 03/25/20   Moshe Cipro, NP  tamsulosin (FLOMAX) 0.4 MG CAPS capsule Take 1 capsule (0.4 mg total) by mouth daily. 03/04/21   Crista Elliot, MD    Family History Family History  Problem Relation Age of Onset   Hypertension Mother    Hypertension Father    Heart disease Brother     Social History Social History   Tobacco Use   Smoking status: Never   Smokeless tobacco: Never  Vaping Use   Vaping Use: Never used  Substance Use Topics   Alcohol use: No    Alcohol/week: 0.0 standard drinks of alcohol   Drug use: No     Allergies   Ace inhibitors   Review of Systems Review of Systems  HENT:  Positive  for ear pain.   Gastrointestinal:  Negative for vomiting.  All other systems reviewed and are negative.    Physical Exam Triage Vital Signs ED Triage Vitals  Enc Vitals Group     BP 05/21/22 1534 (!) 168/83     Pulse Rate 05/21/22 1534 91     Resp 05/21/22 1534 18     Temp 05/21/22 1534 98.8 F (37.1 C)     Temp Source 05/21/22 1534 Oral     SpO2 05/21/22 1534 95 %     Weight --      Height --      Head Circumference --      Peak Flow --      Pain Score 05/21/22 1441 7     Pain Loc --      Pain Edu? --      Excl. in GC? --    No data found.  Updated Vital Signs BP  (!) 168/83 (BP Location: Left Arm)   Pulse 91   Temp 98.8 F (37.1 C) (Oral)   Resp 18   SpO2 95%   Visual Acuity Right Eye Distance:   Left Eye Distance:   Bilateral Distance:    Right Eye Near:   Left Eye Near:    Bilateral Near:     Physical Exam Vitals and nursing note reviewed.  Constitutional:      Appearance: He is well-developed.  HENT:     Head: Normocephalic.     Ears:     Comments: Swollen right tm     Mouth/Throat:     Mouth: Mucous membranes are moist.  Eyes:     Pupils: Pupils are equal, round, and reactive to light.  Cardiovascular:     Rate and Rhythm: Normal rate and regular rhythm.  Pulmonary:     Effort: Pulmonary effort is normal.  Abdominal:     General: There is no distension.  Musculoskeletal:        General: Normal range of motion.     Cervical back: Normal range of motion.  Skin:    General: Skin is warm.  Neurological:     Mental Status: He is alert and oriented to person, place, and time.  Psychiatric:        Mood and Affect: Mood normal.      UC Treatments / Results  Labs (all labs ordered are listed, but only abnormal results are displayed) Labs Reviewed - No data to display  EKG   Radiology No results found.  Procedures Procedures (including critical care time)  Medications Ordered in UC Medications - No data to display  Initial Impression / Assessment and Plan / UC Course  I have reviewed the triage vital signs and the nursing notes.  Pertinent labs & imaging results that were available during my care of the patient were reviewed by me and considered in my medical decision making (see chart for details).      Final Clinical Impressions(s) / UC Diagnoses   Final diagnoses:  Right otitis media, unspecified otitis media type     Discharge Instructions      Return if any problems.   ED Prescriptions     Medication Sig Dispense Auth. Provider   amoxicillin (AMOXIL) 500 MG capsule Take 1 capsule (500 mg  total) by mouth 3 (three) times daily for 10 days. 30 capsule Elson Areas, [place]      PDMP not reviewed this encounter. An After Visit Summary was printed and given to  the patient.    Elson Areas, [place] 05/22/22 1759

## 2022-10-16 ENCOUNTER — Encounter: Payer: Self-pay | Admitting: Gastroenterology

## 2022-11-25 ENCOUNTER — Telehealth: Payer: Self-pay | Admitting: *Deleted

## 2022-11-25 NOTE — Telephone Encounter (Signed)
Yesi,  This pt's BMI is greater than 50; their procedure will need to be performed at the hospital.  Thanks,  Chasitie Passey 

## 2022-11-28 ENCOUNTER — Telehealth: Payer: Self-pay

## 2022-11-28 NOTE — Telephone Encounter (Signed)
Lets do OV first RG 

## 2022-11-28 NOTE — Telephone Encounter (Signed)
Pt made aware of the change due to his BMI: Pt scheduled to see Dr. Lyndel Safe on 12/14/2022 at 8:50 AM to see Dr. Lyndel Safe: Pt made aware Previsit appt and Colonoscopy appt canceled: Pt made aware Pt verbalized understanding with all questions answered.

## 2022-11-28 NOTE — Telephone Encounter (Signed)
This patients BMI is > 50. Patient will need to be rescheduled at the hospital. I have added Dr. Lyndel Safe to this message I am not sure if he would like a OV or direct to the hospital. His PV is scheduled for the 19th in the event we don't get him an apt before then his PV will need to be rescheduled as well.  Thank you Sammie Bench, Previsit

## 2022-12-01 NOTE — Telephone Encounter (Signed)
Scheduled for OV on 12-14-22 w/Dr Lyndel Safe.

## 2022-12-14 ENCOUNTER — Ambulatory Visit: Payer: 59 | Admitting: Gastroenterology

## 2022-12-29 ENCOUNTER — Encounter: Payer: 59 | Admitting: Gastroenterology

## 2023-01-31 ENCOUNTER — Telehealth: Payer: Self-pay

## 2023-01-31 NOTE — Telephone Encounter (Signed)
Left message for pt to call back to schedule office visit prior to procedure

## 2023-02-01 NOTE — Telephone Encounter (Signed)
Pt stated that he would call back when he is ready to be scheduled. Pt was notified that I would take him off the wait list for now and once he is ready to be scheduled then  to please call and set up an office visit. Pt verbalized understanding with all questions answered.

## 2023-08-15 ENCOUNTER — Ambulatory Visit (HOSPITAL_COMMUNITY)
Admission: EM | Admit: 2023-08-15 | Discharge: 2023-08-15 | Disposition: A | Discharge status: Home / Self Care | Payer: 59 | Attending: Emergency Medicine | Admitting: Emergency Medicine

## 2023-08-15 ENCOUNTER — Encounter (HOSPITAL_COMMUNITY): Payer: Self-pay

## 2023-08-15 DIAGNOSIS — S0501XA Injury of conjunctiva and corneal abrasion without foreign body, right eye, initial encounter: Secondary | ICD-10-CM | POA: Diagnosis not present

## 2023-08-15 MED ORDER — ERYTHROMYCIN 5 MG/GM OP OINT: TOPICAL_OINTMENT | OPHTHALMIC | 0 refills | Status: AC

## 2023-08-15 MED ORDER — FLUORESCEIN SODIUM 1 MG OP STRP
ORAL_STRIP | OPHTHALMIC | Status: AC
  Filled 2023-08-15: qty 1

## 2023-08-15 MED ORDER — EYE WASH OP SOLN
OPHTHALMIC | Status: AC
  Filled 2023-08-15: qty 118

## 2023-08-15 MED ORDER — TETRACAINE HCL 0.5 % OP SOLN
OPHTHALMIC | Status: AC
  Filled 2023-08-15: qty 4

## 2023-08-15 NOTE — ED Provider Notes (Signed)
[facility]    CSN: [id number] Arrival date & time: 08/15/23  0830      History   Chief Complaint Chief Complaint  Patient presents with   Eye Pain    HPI Justin Alvarez is a 47 y.o. male.   Patient presents to clinic for complaint of right eye pain.  He was outside walking to work when the wind blew something into his eye.  He immediately had pain, rinsed his eye out at work but the sensation persisted.  Feels like he has something in his eye.   While sitting in the exam room he had his eyes closed for about 5 minutes.  Upon opening his eyes reports that the pain had resolved and he just had some minor irritation.  No vision changes.  He does not wear contacts. No photophobia.      The history is provided by the patient and medical records.  Eye Pain    Past Medical History:  Diagnosis Date   Anxiety    Depression    GERD (gastroesophageal reflux disease)    Glucose intolerance (impaired glucose tolerance)    HTN (hypertension)    OCD (obsessive compulsive disorder)     Patient Active Problem List   Diagnosis Date Noted   Morbid obesity (HCC) 12/10/2014   Essential hypertension 12/10/2014    Past Surgical History:  Procedure Laterality Date   CYSTOSCOPY/URETEROSCOPY/HOLMIUM LASER/STENT PLACEMENT Right 03/04/2021   Procedure: CYSTOSCOPY/URETEROSCOPY/HOLMIUM LASER/STENT PLACEMENT;  Surgeon: Crista Elliot, MD;  Location: [facility];  Service: Urology;  Laterality: Right;       Home Medications    Prior to Admission medications   Medication Sig Start Date End Date Taking? Authorizing Provider  DULoxetine (CYMBALTA) 60 MG capsule Take 60 mg by mouth daily. 10/28/14  Yes [provider]  erythromycin ophthalmic ointment Place a 1/2 inch ribbon of ointment into the lower eyelid 4x daily for the next 5 days. 08/15/23  Yes Rinaldo Ratel, [place], FNP  losartan (COZAAR) 50 MG tablet Take 50 mg by mouth daily. 11/18/14  Yes [provider]   metoprolol succinate (TOPROL-XL) 50 MG 24 hr tablet Take 50 mg by mouth daily. 12/04/14  Yes [provider]  ALPRAZolam Prudy Feeler) 0.5 MG tablet Take 0.5 mg by mouth daily as needed for anxiety. For anxiety 09/03/14   [provider]  Ascorbic Acid (VITAMIN C) 1000 MG tablet Take 1,000 mg by mouth daily. Reported on 05/07/2016    [provider]  cephALEXin (KEFLEX) 500 MG capsule Take 1 capsule (500 mg total) by mouth 4 (four) times daily. Patient not taking: Reported on 03/04/2021 08/15/19   Eustace Moore, MD  DEXILANT 30 MG capsule Take 30 mg by mouth daily. 12/01/14   [provider]  HYDROcodone-acetaminophen (NORCO/VICODIN) 5-325 MG tablet Take 1 tablet by mouth every 6 (six) hours as needed for moderate pain or severe pain. 03/04/21   [provider]  silver sulfADIAZINE (SILVADENE) 1 % cream Apply 1 application topically daily. Patient not taking: Reported on 03/04/2021 03/25/20   Moshe Cipro, FNP  tamsulosin (FLOMAX) 0.4 MG CAPS capsule Take 1 capsule (0.4 mg total) by mouth daily. 03/04/21   Crista Elliot, MD    Family History Family History  Problem Relation Age of Onset   Hypertension Mother    Hypertension Father    Heart disease Brother     Social History Social History   Tobacco Use   Smoking status: Never  Smokeless tobacco: Never  Vaping Use   Vaping status: Never Used  Substance Use Topics   Alcohol use: No    Alcohol/week: 0.0 standard drinks of alcohol   Drug use: No     Allergies   Ace inhibitors   Review of Systems Review of Systems  Eyes:  Positive for pain, discharge and redness. Negative for photophobia, itching and visual disturbance.     Physical Exam Triage Vital Signs ED Triage Vitals  Encounter Vitals Group     BP 08/15/23 0902 138/84     Systolic BP Percentile --      Diastolic BP Percentile --      Pulse Rate 08/15/23 0902 66     Resp 08/15/23 0902 18     Temp 08/15/23 0902  98.7 F (37.1 C)     Temp Source 08/15/23 0902 Oral     SpO2 08/15/23 0902 96 %     Weight --      Height --      Head Circumference --      Peak Flow --      Pain Score 08/15/23 0904 5     Pain Loc --      Pain Education --      Exclude from Growth Chart --    No data found.  Updated Vital Signs BP 138/84 (BP Location: Left Arm)   Pulse 66   Temp 98.7 F (37.1 C) (Oral)   Resp 18   SpO2 96%   Visual Acuity Right Eye Distance:   Left Eye Distance:   Bilateral Distance:    Right Eye Near:   Left Eye Near:    Bilateral Near:     Physical Exam Vitals and nursing note reviewed.  Constitutional:      Appearance: Normal appearance.  HENT:     Head: Normocephalic and atraumatic.     Right Ear: External ear normal.     Left Ear: External ear normal.     Nose: Nose normal.     Mouth/Throat:     Mouth: Mucous membranes are moist.  Eyes:     General: Lids are normal. Lids are everted, no foreign bodies appreciated. Vision grossly intact. Gaze aligned appropriately.        Right eye: Discharge present.     Extraocular Movements: Extraocular movements intact.     Conjunctiva/sclera:     Right eye: Right conjunctiva is injected.     Pupils: Pupils are equal, round, and reactive to light.     Comments: Watery discharge noted at inner canthus of right eye with minor conjunctival injection.  Fluorescein stain revealed small corneal abrasion to upper inner eye.   Cardiovascular:     Rate and Rhythm: Normal rate.  Pulmonary:     Effort: Pulmonary effort is normal.  Skin:    General: Skin is warm and dry.  Neurological:     General: No focal deficit present.     Mental Status: He is alert and oriented to person, place, and time.  Psychiatric:        Mood and Affect: Mood normal.        Behavior: Behavior normal. Behavior is cooperative.      UC Treatments / Results  Labs (all labs ordered are listed, but only abnormal results are displayed) Labs Reviewed - No data to  display  EKG   Radiology No results found.  Procedures Procedures (including critical care time)  Medications Ordered in UC Medications - No  data to display  Initial Impression / Assessment and Plan / UC Course  I have reviewed the triage vital signs and the nursing notes.  Pertinent labs & imaging results that were available during my care of the patient were reviewed by me and considered in my medical decision making (see chart for details).  Vitals and triage reviewed, patient is hemodynamically stable.  Vision intact, no foreign body noted on exam with lid eversion.  Fluorescein stain did reveal a small corneal abrasion.  Will cover with erythromycin ointment to prevent secondary bacterial infection.  Strict ophthalmology and emergency department precautions given.  Plan of care, follow-up care and return precautions given, no questions at this time.     Final Clinical Impressions(s) / UC Diagnoses   Final diagnoses:  Abrasion of right cornea, initial encounter     Discharge Instructions      If your eye discomfort returns once you leave you can use the eye rinse provided to you.  You can alternate between Tylenol and ibuprofen every 4-6 hours for any pain or discomfort.  Use the erythromycin ointment 4 times daily to help prevent against bacterial infection.  Seek immediate care if you develop sudden severe pain or vision loss at the nearest emergency department or an ophthalmologist.  Return to clinic for any new or urgent symptoms.     ED Prescriptions     Medication Sig Dispense Auth. Provider   erythromycin ophthalmic ointment Place a 1/2 inch ribbon of ointment into the lower eyelid 4x daily for the next 5 days. 3.5 g Maisen Schmit, [place], FNP      PDMP not reviewed this encounter.   Rinaldo Ratel [place] N, [place] 08/15/23 [phone]

## 2023-08-15 NOTE — Discharge Instructions (Signed)
If your eye discomfort returns once you leave you can use the eye rinse provided to you.  You can alternate between Tylenol and ibuprofen every 4-6 hours for any pain or discomfort.  Use the erythromycin ointment 4 times daily to help prevent against bacterial infection.  Seek immediate care if you develop sudden severe pain or vision loss at the nearest emergency department or an ophthalmologist.  Return to clinic for any new or urgent symptoms.

## 2023-08-15 NOTE — ED Triage Notes (Addendum)
Pt reports eye pain and pressure that started today. Pt thinks a bug flew in his right eye.

## 2023-09-10 ENCOUNTER — Encounter (HOSPITAL_BASED_OUTPATIENT_CLINIC_OR_DEPARTMENT_OTHER): Payer: Self-pay

## 2023-09-10 ENCOUNTER — Other Ambulatory Visit: Payer: Self-pay

## 2023-09-10 ENCOUNTER — Emergency Department (HOSPITAL_BASED_OUTPATIENT_CLINIC_OR_DEPARTMENT_OTHER): Payer: 59

## 2023-09-10 ENCOUNTER — Emergency Department (HOSPITAL_BASED_OUTPATIENT_CLINIC_OR_DEPARTMENT_OTHER)
Admission: EM | Admit: 2023-09-10 | Discharge: 2023-09-10 | Disposition: A | Discharge status: Home / Self Care | Payer: 59 | Attending: Emergency Medicine | Admitting: Emergency Medicine

## 2023-09-10 ENCOUNTER — Emergency Department (HOSPITAL_BASED_OUTPATIENT_CLINIC_OR_DEPARTMENT_OTHER): Payer: 59 | Admitting: Radiology

## 2023-09-10 DIAGNOSIS — W12XXXA Fall on and from scaffolding, initial encounter: Secondary | ICD-10-CM | POA: Insufficient documentation

## 2023-09-10 DIAGNOSIS — Z79899 Other long term (current) drug therapy: Secondary | ICD-10-CM | POA: Diagnosis not present

## 2023-09-10 DIAGNOSIS — S30811A Abrasion of abdominal wall, initial encounter: Secondary | ICD-10-CM | POA: Insufficient documentation

## 2023-09-10 DIAGNOSIS — S3991XA Unspecified injury of abdomen, initial encounter: Secondary | ICD-10-CM | POA: Diagnosis present

## 2023-09-10 DIAGNOSIS — I10 Essential (primary) hypertension: Secondary | ICD-10-CM | POA: Diagnosis not present

## 2023-09-10 DIAGNOSIS — R0789 Other chest pain: Secondary | ICD-10-CM | POA: Diagnosis not present

## 2023-09-10 DIAGNOSIS — R918 Other nonspecific abnormal finding of lung field: Secondary | ICD-10-CM | POA: Insufficient documentation

## 2023-09-10 DIAGNOSIS — R9389 Abnormal findings on diagnostic imaging of other specified body structures: Secondary | ICD-10-CM

## 2023-09-10 LAB — BASIC METABOLIC PANEL
Anion gap: 8 (ref 5–15)
BUN: 13 mg/dL (ref 6–20)
CO2: 28 mmol/L (ref 22–32)
Calcium: 9.8 mg/dL (ref 8.9–10.3)
Chloride: 102 mmol/L (ref 98–111)
Creatinine, Ser: 0.65 mg/dL (ref 0.61–1.24)
GFR, Estimated: >60 mL/min (ref >60)
Glucose, Bld: 117 mg/dL — ABNORMAL HIGH (ref 70–99)
Potassium: 4.2 mmol/L (ref 3.5–5.1)
Sodium: 138 mmol/L (ref 135–145)

## 2023-09-10 LAB — CBC WITH DIFFERENTIAL/PLATELET
Abs Immature Granulocytes: 0.09 10*3/uL — ABNORMAL HIGH (ref 0.00–0.07)
Basophils Absolute: 0.1 10*3/uL (ref 0.0–0.1)
Basophils Relative: 0 %
Eosinophils Absolute: 0.1 10*3/uL (ref 0.0–0.5)
Eosinophils Relative: 1 %
HCT: 43.8 % (ref 39.0–52.0)
Hemoglobin: 14.5 g/dL (ref 13.0–17.0)
Immature Granulocytes: 1 %
Lymphocytes Relative: 8 %
Lymphs Abs: 1.2 10*3/uL (ref 0.7–4.0)
MCH: 27.9 pg (ref 26.0–34.0)
MCHC: 33.1 g/dL (ref 30.0–36.0)
MCV: 84.2 fL (ref 80.0–100.0)
Monocytes Absolute: 1 10*3/uL (ref 0.1–1.0)
Monocytes Relative: 6 %
Neutro Abs: 13.1 10*3/uL — ABNORMAL HIGH (ref 1.7–7.7)
Neutrophils Relative %: 84 %
Platelets: 312 10*3/uL (ref 150–400)
RBC: 5.2 MIL/uL (ref 4.22–5.81)
RDW: 13.9 % (ref 11.5–15.5)
WBC: 15.6 10*3/uL — ABNORMAL HIGH (ref 4.0–10.5)
nRBC: 0 % (ref 0.0–0.2)

## 2023-09-10 MED ORDER — MORPHINE SULFATE (PF) 4 MG/ML IV SOLN
4.0000 mg | Freq: Once | INTRAVENOUS | Status: AC
  Administered 2023-09-10: 4 mg via INTRAVENOUS
  Filled 2023-09-10: qty 1

## 2023-09-10 MED ORDER — IBUPROFEN 600 MG PO TABS
600.0000 mg | ORAL_TABLET | Freq: Four times a day (QID) | ORAL | 0 refills | Status: AC | PRN
Start: 2023-09-10 — End: ?

## 2023-09-10 MED ORDER — IOHEXOL 300 MG/ML  SOLN
100.0000 mL | Freq: Once | INTRAMUSCULAR | Status: AC | PRN
  Administered 2023-09-10: 100 mL via INTRAVENOUS

## 2023-09-10 MED ORDER — CYCLOBENZAPRINE HCL 10 MG PO TABS: 10.0000 mg | ORAL_TABLET | Freq: Two times a day (BID) | ORAL | 0 refills | Status: AC | PRN

## 2023-09-10 NOTE — Discharge Instructions (Signed)
You have been evaluated for your fall.  Fortunately no significant internal injury noted on today's exam.  Incidentally it appears you may have signs of a lung infection based on CT scan however if you are not having any trouble breathing fever or cough it is less likely to be pneumonia.  You may follow-up with your doctor for further care.  Take medication prescribed as needed.  Return if you have any concern.

## 2023-09-10 NOTE — ED Provider Notes (Signed)
[place] [facility] AT [facility] Provider Note   CSN: [id number] Arrival date & time: 09/10/23  1550     History  Chief Complaint  Patient presents with   Justin Alvarez is a 47 y.o. male.  The history is provided by the patient and medical records. No language interpreter was used.  Fall     47 year old male significant history of anxiety, depression, hypertension, OCD, presenting to the ED for evaluation of a fall.  Patient report earlier today he was standing on a 10 foot scaffold while moving a log when he lost balance and fell landed directly on the right side of his chest and back against the ground.  He did not hit his head no loss of consciousness but states that it knocked the wind out of him.  Took him a few minutes to gather himself to stand up.  He then noticed pain primarily on the right side of his back and chest from the impact.  Currently rates pain as 7 out of 10 nonradiating worsening with movement.  No complaint of headache neck pain or pain to his extremity.  Denies any precipitating symptoms prior to the fall.  He is not on any blood thinner medication.  He denies any specific treatment tried.  He is up-to-date with tetanus.  No report of any hematuria  Home Medications Prior to Admission medications   Medication Sig Start Date End Date Taking? Authorizing Provider  ALPRAZolam Prudy Feeler) 0.5 MG tablet Take 0.5 mg by mouth daily as needed for anxiety. For anxiety 09/03/14   [provider]  Ascorbic Acid (VITAMIN C) 1000 MG tablet Take 1,000 mg by mouth daily. Reported on 05/07/2016    [provider]  cephALEXin (KEFLEX) 500 MG capsule Take 1 capsule (500 mg total) by mouth 4 (four) times daily. Patient not taking: Reported on 03/04/2021 08/15/19   Eustace Moore, MD  DEXILANT 30 MG capsule Take 30 mg by mouth daily. 12/01/14   [provider]  DULoxetine (CYMBALTA) 60 MG capsule Take 60 mg by mouth daily.  10/28/14   [provider]  erythromycin ophthalmic ointment Place a 1/2 inch ribbon of ointment into the lower eyelid 4x daily for the next 5 days. 08/15/23   Garrison, [place] N, FNP  HYDROcodone-acetaminophen (NORCO/VICODIN) 5-325 MG tablet Take 1 tablet by mouth every 6 (six) hours as needed for moderate pain or severe pain. 03/04/21   [provider]  losartan (COZAAR) 50 MG tablet Take 50 mg by mouth daily. 11/18/14   [provider]  metoprolol succinate (TOPROL-XL) 50 MG 24 hr tablet Take 50 mg by mouth daily. 12/04/14   [provider]  silver sulfADIAZINE (SILVADENE) 1 % cream Apply 1 application topically daily. Patient not taking: Reported on 03/04/2021 03/25/20   Moshe Cipro, FNP  tamsulosin (FLOMAX) 0.4 MG CAPS capsule Take 1 capsule (0.4 mg total) by mouth daily. 03/04/21   Crista Elliot, MD      Allergies    Ace inhibitors    Review of Systems   Review of Systems  All other systems reviewed and are negative.   Physical Exam Updated Vital Signs BP (!) 148/74 (BP Location: Left Arm)   Pulse 64   Temp (!) 97.5 F (36.4 C) (Oral)   Resp 17   Ht 5\' 11"  (1.803 m)   Wt 133.8 kg   SpO2 100%   BMI 41.14 kg/m  Physical Exam Vitals and nursing  note reviewed.  Constitutional:      General: He is not in acute distress.    Appearance: He is well-developed.  HENT:     Head: Atraumatic.  Eyes:     Conjunctiva/sclera: Conjunctivae normal.  Cardiovascular:     Rate and Rhythm: Normal rate and regular rhythm.  Pulmonary:     Effort: Pulmonary effort is normal.     Breath sounds: Normal breath sounds. No wheezing, rhonchi or rales.  Chest:     Chest wall: Tenderness (Tenderness to right lateral chest wall no bruising noted.  No crepitus or emphysema.) present.  Abdominal:     General: Abdomen is flat.     Palpations: Abdomen is soft.     Tenderness: There is no abdominal tenderness.  Musculoskeletal:        General: Tenderness  (Skin abrasion noted to right flank region with tenderness to palpation but no ecchymosis.) present. Normal range of motion.     Cervical back: Normal range of motion and neck supple.     Comments: No midline spine tenderness crepitus or step-off.  Right shoulder with full range of motion no deformity noted.  Skin:    Findings: No rash.  Neurological:     Mental Status: He is alert and oriented to person, place, and time.     ED Results / Procedures / Treatments   Labs (all labs ordered are listed, but only abnormal results are displayed) Labs Reviewed  BASIC METABOLIC PANEL - Abnormal; Notable for the following components:      Result Value   Glucose, Bld 117 (*)    All other components within normal limits  CBC WITH DIFFERENTIAL/PLATELET - Abnormal; Notable for the following components:   WBC 15.6 (*)    Neutro Abs 13.1 (*)    Abs Immature Granulocytes 0.09 (*)    All other components within normal limits  URINALYSIS, ROUTINE W REFLEX MICROSCOPIC    EKG None  Radiology CT CHEST ABDOMEN PELVIS W CONTRAST  Result Date: 09/10/2023 CLINICAL DATA:  Trauma, fall. EXAM: CT CHEST, ABDOMEN, AND PELVIS WITH CONTRAST TECHNIQUE: Multidetector CT imaging of the chest, abdomen and pelvis was performed following the standard protocol during bolus administration of intravenous contrast. RADIATION DOSE REDUCTION: This exam was performed according to the departmental dose-optimization program which includes automated exposure control, adjustment of the mA and/or kV according to patient size and/or use of iterative reconstruction technique. CONTRAST:  OMNIPAQUE IOHEXOL 300 MG/ML  SOLN COMPARISON:  CT abdomen and pelvis 03/03/2021 FINDINGS: CT CHEST FINDINGS Cardiovascular: No significant vascular findings. Normal heart size. No pericardial effusion. Mediastinum/Nodes: Well-circumscribed 1.5 x 2.1 x 2.9 cm low-attenuation area identified in the low right paratracheal region. No other enlarged  lymph nodes. Esophagus and thyroid gland are within normal limits. Lungs/Pleura: Focal patchy cluster of tree-in-bud and ground-glass opacity seen in the right upper lobe image 4/60 measuring 1.5 by 0.9 cm. The lungs are otherwise clear. There is a trace right pleural effusion. No pneumothorax. Musculoskeletal: No acute fractures are seen. CT ABDOMEN PELVIS FINDINGS Hepatobiliary: No hepatic injury or perihepatic hematoma. Gallstones are present. Pancreas: Unremarkable. No pancreatic ductal dilatation or surrounding inflammatory changes. Spleen: Normal in size without focal abnormality. Adrenals/Urinary Tract: There is a 1.7 cm cyst in the left kidney. Otherwise, the adrenal glands, kidneys and bladder are within normal limits. Stomach/Bowel: Stomach is within normal limits. Appendix appears normal. No evidence of bowel wall thickening, distention, or inflammatory changes. Vascular/Lymphatic: No significant vascular findings are present. No  enlarged abdominal or pelvic lymph nodes. Reproductive: Prostate is unremarkable. Other: No abdominal wall hernia or abnormality. No abdominopelvic ascites. Musculoskeletal: No acute fractures are seen. IMPRESSION: 1. No acute posttraumatic sequelae in the chest, abdomen or pelvis. 2. Trace right pleural effusion. 3. Focal patchy cluster of tree-in-bud and ground-glass opacity in the right upper lobe, likely infectious/inflammatory. 4. Well-circumscribed low-attenuation area in the low right paratracheal region, favored as cystic and benign. Other etiologies are not excluded. 5. Cholelithiasis. 6. Left Bosniak I benign renal cyst measuring 1.7 cm. No follow-up imaging is recommended. JACR 2018 Feb; 264-273, Management of the Incidental Renal Mass on CT, [employer] 2021; 814-848, Bosniak Classification of Cystic Renal Masses, Version 2019. Electronically Signed   By: Darliss Cheney M.D.   On: 09/10/2023 22:14   DG Shoulder Right  Result Date: 09/10/2023 CLINICAL DATA:  Larey Seat  onto right shoulder. EXAM: RIGHT SHOULDER - 2+ VIEW COMPARISON:  None Available. FINDINGS: There is no evidence of fracture or dislocation. Soft tissues are unremarkable. IMPRESSION: Negative. Electronically Signed   By: Minerva Fester M.D.   On: 09/10/2023 20:14   DG Ribs Unilateral W/Chest Right  Result Date: 09/10/2023 CLINICAL DATA:  Larey Seat landing on right rib and shoulder EXAM: RIGHT RIBS AND CHEST - 3+ VIEW COMPARISON:  10/07/2010 FINDINGS: Low lung volumes. Normal cardiomediastinal silhouette. No focal consolidation, pleural effusion, or pneumothorax. The area of symptomatic concern as indicated by the patient was denoted with a metallic skin BB by the technologist. No displaced rib fractures. IMPRESSION: No displaced rib fractures. Electronically Signed   By: Minerva Fester M.D.   On: 09/10/2023 20:13    Procedures Procedures    Medications Ordered in ED Medications  morphine (PF) 4 MG/ML injection 4 mg (4 mg Intravenous Given 09/10/23 1755)  iohexol (OMNIPAQUE) 300 MG/ML solution 100 mL (100 mLs Intravenous Contrast Given 09/10/23 1827)    ED Course/ Medical Decision Making/ A&P                                 Medical Decision Making Amount and/or Complexity of Data Reviewed Labs: ordered. Radiology: ordered.  Risk Prescription drug management.   BP (!) 148/74 (BP Location: Left Arm)   Pulse 64   Temp (!) 97.5 F (36.4 C) (Oral)   Resp 17   Ht 5\' 11"  (1.803 m)   Wt 133.8 kg   SpO2 100%   BMI 41.14 kg/m   66:61 PM   47 year old male significant history of anxiety, depression, hypertension, OCD, presenting to the ED for evaluation of a fall.  Patient report earlier today he was standing on a 10 foot scaffold while moving a log when he lost balance and fell landed directly on the right side of his chest and back against the ground.  He did not hit his head no loss of consciousness but states that it knocked the wind out of him.  Took him a few minutes to gather  himself to stand up.  He then noticed pain primarily on the right side of his back and chest from the impact.  Currently rates pain as 7 out of 10 nonradiating worsening with movement.  No complaint of headache neck pain or pain to his extremity.  Denies any precipitating symptoms prior to the fall.  He is not on any blood thinner medication.  He denies any specific treatment tried.  He is up-to-date with tetanus.  No report of any  hematuria  On exam, patient is sitting in chair appears to be in no acute discomfort.  Exam is remarkable for abrasion noted to right flank with tenderness palpation.  He also has some tenderness to his right anterior lateral chest wall without any crepitus or emphysema.  His right shoulder with full range of motion and no significant tenderness to all 4 extremities including the feet.  Vital sign overall reassuring.  Given mechanism of injury, will obtain chest abdomen pelvis CT to rule out any internal injury such as liver laceration or kidney injury.  Patient given morphine for pain control.  He is up-to-date with tetanus.  -Labs ordered, independently viewed and interpreted by me.  Labs remarkable for WBC 15.6 -The patient was maintained on a cardiac monitor.  I personally viewed and interpreted the cardiac monitored which showed an underlying rhythm of: sinus bradycardia -Imaging independently viewed and interpreted by me and I agree with radiologist's interpretation.  Result remarkable for CT Chest/abd/pelvis without acute post traumatic injury however pt has finding in his R upper lung suggestive of pneumonia.  Since pt has elevated WBC and potential pneumonia, I offer abx but pt denies having chest pain, sob, cough, or fever and decline abx.  -This patient presents to the ED for concern of fall, this involves an extensive number of treatment options, and is a complaint that carries with it a high risk of complications and morbidity.  The differential diagnosis includes  fx, dislocation, contusion, internal bleeding -Co morbidities that complicate the patient evaluation includes HTN, GERD, depression -Treatment includes morphine -Reevaluation of the patient after these medicines showed that the patient improved -PCP office notes or outside notes reviewed -Escalation to admission/observation considered: patients feels much better, is comfortable with discharge, and will follow up with PCP -Prescription medication considered, patient comfortable with ibuprofen/flexeril -Social Determinant of Health considered           Final Clinical Impression(s) / ED Diagnoses Final diagnoses:  Fall from scaffold, initial encounter  Abnormal chest CT    Rx / DC Orders ED Discharge Orders          Ordered    ibuprofen (ADVIL) 600 MG tablet  Every 6 hours PRN        09/10/23 2234    cyclobenzaprine (FLEXERIL) 10 MG tablet  2 times daily PRN        09/10/23 2234              Fayrene Helper, PA-C 09/10/23 2236    Anders Simmonds T, DO 09/11/23 2351

## 2023-09-10 NOTE — ED Triage Notes (Signed)
Pt states he fell off  10' scaffold landing on his rt. Shoulder and rt. Rib area. States it "knocked the breath out of me"
